# Patient Record
Sex: Female | Born: 1980 | Race: White | Hispanic: No | Marital: Married | Smoking: Current every day smoker
Health system: Southern US, Community
[De-identification: ages and names within clinical notes are randomized; demographics above are authoritative.]

## PROBLEM LIST (undated history)

## (undated) DIAGNOSIS — N289 Disorder of kidney and ureter, unspecified: Secondary | ICD-10-CM

## (undated) DIAGNOSIS — N83209 Unspecified ovarian cyst, unspecified side: Secondary | ICD-10-CM

## (undated) HISTORY — PX: OVARIAN CYST REMOVAL: SHX89

## (undated) HISTORY — PX: ECTOPIC PREGNANCY SURGERY: SHX613

---

## 2004-06-15 ENCOUNTER — Emergency Department: Payer: Self-pay | Admitting: Emergency Medicine

## 2005-06-14 ENCOUNTER — Emergency Department: Payer: Self-pay | Admitting: Emergency Medicine

## 2006-11-22 ENCOUNTER — Emergency Department: Payer: Self-pay | Admitting: Emergency Medicine

## 2007-10-11 ENCOUNTER — Emergency Department: Payer: Self-pay | Admitting: Emergency Medicine

## 2008-04-25 ENCOUNTER — Ambulatory Visit: Payer: Self-pay

## 2008-07-16 ENCOUNTER — Emergency Department: Payer: Self-pay | Admitting: Internal Medicine

## 2008-12-28 ENCOUNTER — Emergency Department: Payer: Self-pay | Admitting: Emergency Medicine

## 2008-12-31 ENCOUNTER — Emergency Department: Payer: Self-pay | Admitting: Emergency Medicine

## 2009-01-03 ENCOUNTER — Emergency Department: Payer: Self-pay | Admitting: Emergency Medicine

## 2009-07-22 ENCOUNTER — Emergency Department: Payer: Self-pay | Admitting: Emergency Medicine

## 2010-01-14 ENCOUNTER — Emergency Department: Payer: Self-pay | Admitting: Unknown Physician Specialty

## 2010-01-17 LAB — PATHOLOGY REPORT

## 2010-03-08 ENCOUNTER — Emergency Department: Payer: Self-pay | Admitting: Emergency Medicine

## 2010-06-22 ENCOUNTER — Emergency Department: Payer: Self-pay | Admitting: Unknown Physician Specialty

## 2011-07-01 ENCOUNTER — Emergency Department: Payer: Self-pay | Admitting: *Deleted

## 2011-09-19 ENCOUNTER — Emergency Department: Payer: Self-pay | Admitting: Emergency Medicine

## 2011-09-19 LAB — PREGNANCY, URINE: Pregnancy Test, Urine: NEGATIVE m[IU]/mL

## 2011-09-26 ENCOUNTER — Emergency Department: Payer: Self-pay | Admitting: Emergency Medicine

## 2014-03-15 ENCOUNTER — Emergency Department: Payer: Self-pay | Admitting: Emergency Medicine

## 2014-03-15 LAB — COMPREHENSIVE METABOLIC PANEL
ALBUMIN: 3.7 g/dL (ref 3.4–5.0)
ALK PHOS: 60 U/L
ANION GAP: 9 (ref 7–16)
BILIRUBIN TOTAL: 0.2 mg/dL (ref 0.2–1.0)
BUN: 11 mg/dL (ref 7–18)
Calcium, Total: 8.8 mg/dL (ref 8.5–10.1)
Chloride: 107 mmol/L (ref 98–107)
Co2: 23 mmol/L (ref 21–32)
Creatinine: 0.64 mg/dL (ref 0.60–1.30)
EGFR (African American): >60
EGFR (Non-African Amer.): >60
Glucose: 95 mg/dL (ref 65–99)
Osmolality: 277 (ref 275–301)
Potassium: 3.2 mmol/L — ABNORMAL LOW (ref 3.5–5.1)
SGOT(AST): 11 U/L — ABNORMAL LOW (ref 15–37)
SGPT (ALT): 18 U/L
SODIUM: 139 mmol/L (ref 136–145)
Total Protein: 6.8 g/dL (ref 6.4–8.2)

## 2014-03-15 LAB — CBC WITH DIFFERENTIAL/PLATELET
BASOS PCT: 0.4 %
Basophil #: 0 10*3/uL (ref 0.0–0.1)
Eosinophil #: 0.2 10*3/uL (ref 0.0–0.7)
Eosinophil %: 2.3 %
HCT: 39.9 % (ref 35.0–47.0)
HGB: 13.6 g/dL (ref 12.0–16.0)
LYMPHS ABS: 2.5 10*3/uL (ref 1.0–3.6)
Lymphocyte %: 24.6 %
MCH: 32.2 pg (ref 26.0–34.0)
MCHC: 34.2 g/dL (ref 32.0–36.0)
MCV: 94 fL (ref 80–100)
MONOS PCT: 8.2 %
Monocyte #: 0.8 x10 3/mm (ref 0.2–0.9)
NEUTROS ABS: 6.4 10*3/uL (ref 1.4–6.5)
Neutrophil %: 64.5 %
PLATELETS: 220 10*3/uL (ref 150–440)
RBC: 4.24 10*6/uL (ref 3.80–5.20)
RDW: 11.7 % (ref 11.5–14.5)
WBC: 10 10*3/uL (ref 3.6–11.0)

## 2014-03-15 LAB — URINALYSIS, COMPLETE
Bilirubin,UR: NEGATIVE
Blood: NEGATIVE
Glucose,UR: NEGATIVE mg/dL (ref 0–75)
Ketone: NEGATIVE
Nitrite: NEGATIVE
Ph: 5 (ref 4.5–8.0)
Protein: NEGATIVE
RBC,UR: 1 /HPF (ref 0–5)
SPECIFIC GRAVITY: 1.009 (ref 1.003–1.030)
Squamous Epithelial: 2
WBC UR: 3 /HPF (ref 0–5)

## 2014-03-15 LAB — WET PREP, GENITAL

## 2014-03-15 LAB — HCG, QUANTITATIVE, PREGNANCY: Beta Hcg, Quant.: 4721 m[IU]/mL — ABNORMAL HIGH

## 2014-03-15 LAB — PREGNANCY, URINE: PREGNANCY TEST, URINE: POSITIVE m[IU]/mL

## 2014-03-18 LAB — GC/CHLAMYDIA PROBE AMP

## 2014-03-28 ENCOUNTER — Emergency Department: Payer: Self-pay | Admitting: Emergency Medicine

## 2014-03-28 LAB — CBC WITH DIFFERENTIAL/PLATELET
Basophil #: 0 10*3/uL (ref 0.0–0.1)
Basophil %: 0.4 %
Eosinophil #: 0.3 10*3/uL (ref 0.0–0.7)
Eosinophil %: 2.7 %
HCT: 40.7 % (ref 35.0–47.0)
HGB: 13.7 g/dL (ref 12.0–16.0)
Lymphocyte #: 2.4 10*3/uL (ref 1.0–3.6)
Lymphocyte %: 25.5 %
MCH: 31.9 pg (ref 26.0–34.0)
MCHC: 33.6 g/dL (ref 32.0–36.0)
MCV: 95 fL (ref 80–100)
Monocyte #: 0.6 x10 3/mm (ref 0.2–0.9)
Monocyte %: 6.9 %
Neutrophil #: 6.1 10*3/uL (ref 1.4–6.5)
Neutrophil %: 64.5 %
Platelet: 234 10*3/uL (ref 150–440)
RBC: 4.29 10*6/uL (ref 3.80–5.20)
RDW: 11.8 % (ref 11.5–14.5)
WBC: 9.4 10*3/uL (ref 3.6–11.0)

## 2014-03-28 LAB — COMPREHENSIVE METABOLIC PANEL
ALBUMIN: 3.9 g/dL (ref 3.4–5.0)
AST: 12 U/L — AB (ref 15–37)
Alkaline Phosphatase: 64 U/L
Anion Gap: 10 (ref 7–16)
BUN: 11 mg/dL (ref 7–18)
Bilirubin,Total: 0.1 mg/dL — ABNORMAL LOW (ref 0.2–1.0)
CALCIUM: 9.4 mg/dL (ref 8.5–10.1)
CREATININE: 0.7 mg/dL (ref 0.60–1.30)
Chloride: 105 mmol/L (ref 98–107)
Co2: 22 mmol/L (ref 21–32)
EGFR (African American): >60
EGFR (Non-African Amer.): >60
Glucose: 99 mg/dL (ref 65–99)
Osmolality: 273 (ref 275–301)
POTASSIUM: 3.3 mmol/L — AB (ref 3.5–5.1)
SGPT (ALT): 31 U/L
SODIUM: 137 mmol/L (ref 136–145)
TOTAL PROTEIN: 7.1 g/dL (ref 6.4–8.2)

## 2014-03-28 LAB — URINALYSIS, COMPLETE
BILIRUBIN, UR: NEGATIVE
Blood: NEGATIVE
Glucose,UR: NEGATIVE mg/dL (ref 0–75)
Granular Cast: 4
Hyaline Cast: 4
Nitrite: NEGATIVE
Ph: 5 (ref 4.5–8.0)
Protein: 100
RBC,UR: 3 /HPF (ref 0–5)
Specific Gravity: 1.023 (ref 1.003–1.030)

## 2014-03-28 LAB — HCG, QUANTITATIVE, PREGNANCY: Beta Hcg, Quant.: 7065 m[IU]/mL — ABNORMAL HIGH

## 2014-04-02 ENCOUNTER — Emergency Department: Payer: Self-pay | Admitting: Emergency Medicine

## 2014-04-02 LAB — COMPREHENSIVE METABOLIC PANEL
Albumin: 3.8 g/dL (ref 3.4–5.0)
Alkaline Phosphatase: 61 U/L
Anion Gap: 11 (ref 7–16)
BUN: 13 mg/dL (ref 7–18)
Bilirubin,Total: 0.4 mg/dL (ref 0.2–1.0)
CALCIUM: 8.8 mg/dL (ref 8.5–10.1)
CO2: 24 mmol/L (ref 21–32)
Chloride: 104 mmol/L (ref 98–107)
Creatinine: 0.7 mg/dL (ref 0.60–1.30)
EGFR (Non-African Amer.): >60
Glucose: 109 mg/dL — ABNORMAL HIGH (ref 65–99)
Osmolality: 278 (ref 275–301)
Potassium: 3.4 mmol/L — ABNORMAL LOW (ref 3.5–5.1)
SGOT(AST): 15 U/L (ref 15–37)
SGPT (ALT): 21 U/L
Sodium: 139 mmol/L (ref 136–145)
Total Protein: 7.1 g/dL (ref 6.4–8.2)

## 2014-04-02 LAB — URINALYSIS, COMPLETE
Bilirubin,UR: NEGATIVE
Glucose,UR: NEGATIVE mg/dL (ref 0–75)
Nitrite: NEGATIVE
Ph: 5 (ref 4.5–8.0)
Protein: 100
RBC,UR: 5 /HPF (ref 0–5)
SPECIFIC GRAVITY: 1.027 (ref 1.003–1.030)

## 2014-04-02 LAB — HCG, QUANTITATIVE, PREGNANCY: BETA HCG, QUANT.: 2530 m[IU]/mL — AB

## 2014-04-02 LAB — CBC
HCT: 31.6 % — AB (ref 35.0–47.0)
HGB: 10.7 g/dL — AB (ref 12.0–16.0)
MCH: 31.7 pg (ref 26.0–34.0)
MCHC: 33.8 g/dL (ref 32.0–36.0)
MCV: 94 fL (ref 80–100)
Platelet: 255 10*3/uL (ref 150–440)
RBC: 3.37 10*6/uL — ABNORMAL LOW (ref 3.80–5.20)
RDW: 11.7 % (ref 11.5–14.5)
WBC: 15.3 10*3/uL — ABNORMAL HIGH (ref 3.6–11.0)

## 2014-04-05 ENCOUNTER — Inpatient Hospital Stay: Payer: Self-pay | Admitting: Obstetrics and Gynecology

## 2014-04-05 LAB — COMPREHENSIVE METABOLIC PANEL
ALK PHOS: 66 U/L
ANION GAP: 10 (ref 7–16)
Albumin: 3.6 g/dL (ref 3.4–5.0)
BILIRUBIN TOTAL: 0.4 mg/dL (ref 0.2–1.0)
BUN: 10 mg/dL (ref 7–18)
Calcium, Total: 9 mg/dL (ref 8.5–10.1)
Chloride: 107 mmol/L (ref 98–107)
Co2: 23 mmol/L (ref 21–32)
Creatinine: 0.68 mg/dL (ref 0.60–1.30)
EGFR (Non-African Amer.): >60
Glucose: 106 mg/dL — ABNORMAL HIGH (ref 65–99)
Osmolality: 279 (ref 275–301)
Potassium: 3.7 mmol/L (ref 3.5–5.1)
SGOT(AST): 15 U/L (ref 15–37)
SGPT (ALT): 19 U/L
Sodium: 140 mmol/L (ref 136–145)
TOTAL PROTEIN: 7.4 g/dL (ref 6.4–8.2)

## 2014-04-05 LAB — URINALYSIS, COMPLETE
Bilirubin,UR: NEGATIVE
GLUCOSE, UR: NEGATIVE mg/dL (ref 0–75)
Ketone: NEGATIVE
NITRITE: NEGATIVE
Ph: 9 (ref 4.5–8.0)
Specific Gravity: 1.017 (ref 1.003–1.030)
Squamous Epithelial: 6
WBC UR: 46 /HPF (ref 0–5)

## 2014-04-05 LAB — WET PREP, GENITAL

## 2014-04-05 LAB — CBC
HCT: 33.9 % — AB (ref 35.0–47.0)
HGB: 11.8 g/dL — ABNORMAL LOW (ref 12.0–16.0)
MCH: 32.5 pg (ref 26.0–34.0)
MCHC: 34.7 g/dL (ref 32.0–36.0)
MCV: 94 fL (ref 80–100)
PLATELETS: 311 10*3/uL (ref 150–440)
RBC: 3.62 10*6/uL — AB (ref 3.80–5.20)
RDW: 11.5 % (ref 11.5–14.5)
WBC: 10.9 10*3/uL (ref 3.6–11.0)

## 2014-04-05 LAB — GC/CHLAMYDIA PROBE AMP

## 2014-04-05 LAB — HCG, QUANTITATIVE, PREGNANCY: Beta Hcg, Quant.: 1151 m[IU]/mL — ABNORMAL HIGH

## 2014-04-06 LAB — CREATININE, SERUM
Creatinine: 0.76 mg/dL (ref 0.60–1.30)
EGFR (Non-African Amer.): >60

## 2014-04-06 LAB — HEMOGLOBIN
HGB: 7.9 g/dL — AB (ref 12.0–16.0)
HGB: 8 g/dL — AB (ref 12.0–16.0)

## 2014-08-20 LAB — SURGICAL PATHOLOGY

## 2014-08-22 NOTE — Op Note (Signed)
PATIENT NAME:  Kelli RaringFLOOD, [name] MR#:  [id number] DATE OF BIRTH:  02/18/1981  DATE OF PROCEDURE:  04/05/2014  PREOPERATIVE DIAGNOSIS: Right-sided ruptured ectopic pregnancy status post methotrexate therapy.   POSTOPERATIVE DIAGNOSIS: Right-sided ruptured ectopic pregnancy status post methotrexate therapy.   PROCEDURE: Right salpingectomy with possible oophorectomy, lysis of adhesions, and laparotomy.   ANESTHESIA: General.   SURGEON: Frederick Marro S. Valentino Saxonherry, MD.   ESTIMATED BLOOD LOSS: 400 mL. There was a hemoperitoneum of 200 mL and surgical blood loss was 200 mL.   OPERATIVE FLUIDS: 2700 mL.   URINE OUTPUT: 100 mL.   COMPLICATIONS: None.   FINDINGS: Uterus sounded to 8 cm. There was ruptured right-sided ectopic pregnancy noted on laparoscopy with a hemoperitoneum of approximately 200 mL. I was unable to visualize the right ovary. It could have possibly been incorporated with the ectopic. There was a normal-appearing left fallopian tube. The left ovary was noted to be normal with a 3 cm simple cyst that was drained with clear fluid.   SPECIMEN: Right tube and possibly right ovary.   CONDITION: Stable.   DESCRIPTION OF PROCEDURE: The patient was taken to the Operating Room where she was placed under general anesthesia without difficulty. She was then placed in the dorsal lithotomy position using Allen stirrups, and prepped and draped in normal sterile fashion for a laparoscopic procedure. A straight catheterization was performed with a return of approximately 10 mL of urine. Next, a sterile speculum was placed into the patient's vagina. A single-tooth tenaculum was used to grasp the anterior lip of the cervix. The uterus was sounded to 8 cm. Next, a Hulka clamp was then placed for uterine manipulation and the single-tooth tenaculum was removed from the patient's cervix. The speculum was also removed from the patient's vagina.   At this time, attention was then turned to the infraumbilical area  where a 5 mm vertical infraumbilical skin incision was made and a 5 mm trocar and sheath was inserted into the patient's abdomen under direct visualization using the 5 mm laparoscope. Once entrance into the abdominal cavity had been confirmed, the abdomen was insufflated adequately using carbon dioxide. The above findings were noted. In addition, there was also noted to be dense omental adhesions to the anterior abdominal wall which partially obscured the view of the ectopic pregnancy and the uterus. After these views were noted, attention was then turned to the patient's left abdomen where a second 5 mm trocar was then inserted under direct visualization careful to avoid any vasculature. Attention was then turned to the patient's right abdomen, where an 11 mm trocar was then inserted again under direct visualization. After this, the Harmonic device was inserted through the 5 mm trocar and the anterior abdominal wall adhesions were then taken down through adhesiolysis. After adhesiolysis had been performed, a grasper was inserted and used to attempt to grasp the ectopic pregnancy. It was noted that the pregnancy was adherent to the right pelvic sidewall as well as to the uterus. Manipulation was performed to free these filmy adhesions. Next, a suction irrigator was inserted to evacuate the hemoperitoneum after this and irrigation was performed. Next, the Harmonic device was used to begin at the cornual region, as the distal region of the tube could not be adequately identified due to rupture of the ectopic. The cornual region was grasped, coagulated, and transected using the Harmonic device. Serial bites were made through the mesosalpinx to attempt to remove the ectopic pregnancy down to the level of the distal end of  the fallopian tube; however, due to distortion of the tube secondary to rupture it was difficult to identify all portions of the distal end. Once the tube had been transected, an Endo Catch bag was  inserted into the 10 mm port and the ectopic pregnancy was placed in the bag and the specimen was removed through the 11 mm port.   After removal of the specimen, the trocar was then reinserted. The 11 mm trocar was then reintroduced into the lateral incision again under direct visualization. Attention was then returned to the area where the ectopic had been excised. There were some areas of bleeding noted. Attempts were made to achieve hemostasis using the Harmonic device, as well as the Kleppinger monopolar instrument; however, hemostasis could not be achieved due to the bleeding and a defective irrigator suctioner. After a third suction device had been utilized and noted to have equipment failure, the decision was made to convert to laparotomy in order to control the bleeding.   All laparoscopic trocars were removed from the patient's abdomen. A Pfannenstiel skin incision was made through a prior incision. The skin incision was taken down to the level of the fascia using the knife. The fascia was then incised in the midline and the incision was extended laterally. The rectus muscles were separated. There was noted to be dense scarring between the rectus muscles and peritoneum and it was difficult to separate bluntly, so a knife was used to transect the muscles in the midline. After separation of the muscles, the peritoneum was identified and entered. The blood was then suctioned from the abdominal cavity and a Balfour retractor was then used for visualization into the abdomen. Moist laparotomy sponges were used to pack away the bowel and other protruding peritoneum.  A single-tooth tenaculum was used to elevate the uterus from the pelvic cavity; however, due to the patient's body habitus and narrow pelvis, the uterus allowed for minimal elevation. Attention was then turned to the patient's right adnexa where several 0 Vicryl interrupted sutures were used to attempt to ligate the area of bleeding. There was  still a small amount of bleeding noted further down beyond the fallopian tube down towards the infundibulopelvic ligament. This area was cauterized using the Bovie until hemostasis was achieved. Once hemostasis was achieved, the abdomen was irrigated and Arista was placed down in the pelvis for added hemostasis. After this, all sponges were removed from the patient's abdomen. The Balfour retractor was removed from the patient's abdomen. The fascia was then closed using a 0 Vicryl in a running fashion. In the prior low transverse incision scar there was dimpling noted causing a retracted scar so the subcutaneous fat tissue level was dissected off to relieve the retraction of the prior scar. The skin was then closed using a running suture of 4-0 Monocryl. After suturing of the skin incision, LiquiBand was placed on top of the incision. Attention was then turned to the laparoscopic port sites. The 11 mm port site was closed. The fascia was attempted to be closed using a 0 Vicryl in a figure-of-eight fashion, and the skin was closed using 4-0 Monocryl. The remaining incision sites were all closed using LiquiBand. The laparoscopic incisions were injected with 0.5% Sensorcaine with epinephrine with a total of approximately 20 mL used. The Pfannenstiel skin incision was then covered above and below using a Lidoderm patch. The patient was awakened and taken to the PACU in stable condition. All instrument, sponge, and needle counts were correct x 2 at  the end of the procedure.    ____________________________ Jacques Earthly. Valentino Saxon, MD asc:at D: 04/06/2014 11:55:00 ET T: 04/06/2014 18:15:44 ET JOB#: [id number]  cc: Jacques Earthly. Valentino Saxon, MD, <Dictator> Fabian November MD ELECTRONICALLY SIGNED 04/27/2014 9:54

## 2015-09-30 ENCOUNTER — Emergency Department (HOSPITAL_COMMUNITY): Payer: Self-pay

## 2015-09-30 ENCOUNTER — Encounter (HOSPITAL_COMMUNITY): Payer: Self-pay | Admitting: Emergency Medicine

## 2015-09-30 ENCOUNTER — Emergency Department (HOSPITAL_COMMUNITY)
Admission: EM | Admit: 2015-09-30 | Discharge: 2015-09-30 | Disposition: A | Discharge status: Home / Self Care | Payer: Self-pay | Attending: Emergency Medicine | Admitting: Emergency Medicine

## 2015-09-30 DIAGNOSIS — Y999 Unspecified external cause status: Secondary | ICD-10-CM | POA: Insufficient documentation

## 2015-09-30 DIAGNOSIS — S93602A Unspecified sprain of left foot, initial encounter: Secondary | ICD-10-CM | POA: Insufficient documentation

## 2015-09-30 DIAGNOSIS — Z79899 Other long term (current) drug therapy: Secondary | ICD-10-CM | POA: Insufficient documentation

## 2015-09-30 DIAGNOSIS — Y939 Activity, unspecified: Secondary | ICD-10-CM | POA: Insufficient documentation

## 2015-09-30 DIAGNOSIS — X501XXA Overexertion from prolonged static or awkward postures, initial encounter: Secondary | ICD-10-CM | POA: Insufficient documentation

## 2015-09-30 DIAGNOSIS — F17210 Nicotine dependence, cigarettes, uncomplicated: Secondary | ICD-10-CM | POA: Insufficient documentation

## 2015-09-30 DIAGNOSIS — Y929 Unspecified place or not applicable: Secondary | ICD-10-CM | POA: Insufficient documentation

## 2015-09-30 HISTORY — DX: Disorder of kidney and ureter, unspecified: N28.9

## 2015-09-30 HISTORY — DX: Unspecified ovarian cyst, unspecified side: N83.209

## 2015-09-30 MED ORDER — NAPROXEN 500 MG PO TABS: 500.0000 mg | ORAL_TABLET | Freq: Two times a day (BID) | ORAL | Status: DC

## 2015-09-30 NOTE — ED Provider Notes (Signed)
History  By signing my name below, I, [name], attest that this documentation has been prepared under the direction and in the presence of Delrick Dehart, PA-C. Electronically Signed: [name], ED Scribe. 09/30/2015. 1:27 PM.  Chief Complaint  Patient presents with  . Foot Pain   The history is provided by the patient and medical records. No language interpreter was used.    HPI Comments:  Kelli Farrell is a 35 y.o. obese female who presents to the Emergency Department complaining of a left foot injury that occurred about one week ago. Pt states she got the left foot caught in a basket, twisted it, and fell. She reports associated swelling and bruising of the left foot. She has not taken anything for pain. Touching the area or bearing weight increases the pain. She denies alleviating factors. She denies head trauma, LOC, numbness, tingling or weakness of the left foot, ankle or leg pain, or open wounds. She denies any other injuries.  Past Medical History  Diagnosis Date  . Ovarian cyst   . Renal disorder     cysts   Past Surgical History  Procedure Laterality Date  . Ovarian cyst removal    . Ectopic pregnancy surgery     No family history on file. Social History  Substance Use Topics  . Smoking status: Current Some Day Smoker -- 1.50 packs/day    Types: Cigarettes  . Smokeless tobacco: None  . Alcohol Use: Yes     Comment: rare   OB History    No data available     Review of Systems  Constitutional: Negative for fever, chills and fatigue.  HENT: Negative for sore throat and trouble swallowing.   Respiratory: Negative for cough, shortness of breath and wheezing.   Cardiovascular: Negative for chest pain and palpitations.  Gastrointestinal: Negative for nausea, vomiting, abdominal pain and blood in stool.  Genitourinary: Negative for dysuria, hematuria and flank pain.  Musculoskeletal: Positive for joint swelling and arthralgias. Negative for myalgias,  back pain, neck pain and neck stiffness.  Skin: Positive for color change. Negative for rash and wound.  Neurological: Negative for dizziness, syncope, weakness and numbness.  Hematological: Does not bruise/bleed easily.    Allergies  Review of patient's allergies indicates no known allergies.  Home Medications   Prior to Admission medications   Medication Sig Start Date End Date Taking? Authorizing Provider  citalopram (CELEXA) 40 MG tablet Take 40 mg by mouth. 11/01/14 11/01/15 Yes Historical Provider, MD  ferrous sulfate 325 (65 FE) MG tablet Take 325 mg by mouth daily with breakfast.   Yes Historical Provider, MD   Triage Vitals: BP 154/95 mmHg  Pulse 74  Temp(Src) 98.7 F (37.1 C) (Oral)  Resp 16  Ht 6' (1.829 m)  Wt 280 lb (127.007 kg)  BMI 37.97 kg/m2  SpO2 100%  LMP 09/16/2015 Physical Exam  Constitutional: She is oriented to person, place, and time. She appears well-developed and well-nourished.  HENT:  Head: Normocephalic and atraumatic.  Eyes: EOM are normal.  Neck: Normal range of motion.  Cardiovascular: Normal rate, regular rhythm and normal heart sounds.  Exam reveals no gallop and no friction rub.   No murmur heard. DP pulses of left foot intact.  Pulmonary/Chest: Effort normal and breath sounds normal. No respiratory distress. She has no wheezes. She has no rales.  Musculoskeletal: She exhibits edema and tenderness.  Tenderness to the dorsal left foot. Ecchymosis of the lateral and plantar foot. Mild to moderate ankle edema. Compartments  soft.  Neurological: She is alert and oriented to person, place, and time.  Sensations intact.  Skin: Skin is warm and dry.  Psychiatric: She has a normal mood and affect. Her behavior is normal.  Nursing note and vitals reviewed.   ED Course  Procedures (including critical care time) DIAGNOSTIC STUDIES: Oxygen Saturation is 100% on RA, normal by my interpretation.   COORDINATION OF CARE: 1:23 PM- Will give referral to  orthopedist and order post op shoe. Advised pt to RICE area. Pt verbalizes understanding and agrees to plan.  Medications - No data to display  Labs Review Labs Reviewed - No data to display  Imaging Review Dg Foot Complete Left  09/30/2015  CLINICAL DATA:  Left foot pain and swelling.  Fall 1 week ago. EXAM: LEFT FOOT - COMPLETE 3+ VIEW COMPARISON:  None. FINDINGS: There is lateral soft tissue swelling over the left ankle. No acute bony abnormality. Specifically, no fracture, subluxation, or dislocation. Soft tissues are intact. IMPRESSION: No acute bony abnormality. Electronically Signed   By: Charlett Nose M.D.   On: 09/30/2015 12:56   I have personally reviewed and evaluated these images and lab results as part of my medical decision-making.   EKG Interpretation None      MDM   Final diagnoses:  Foot sprain, left, initial encounter    Pt with foot pain after a fall.  XR neg, NV intact.  Likely sprain.  Pt agrees to symptomatic tx, ice, post op shoe applied.  Ortho referral given   I personally performed the services described in this documentation, which was scribed in my presence. The recorded information has been reviewed and is accurate.     Pauline Aus, PA-C 10/01/15 2106  Gwyneth Sprout, MD 10/03/15 2315

## 2015-09-30 NOTE — Discharge Instructions (Signed)
Cryotherapy Cryotherapy is when you put ice on your injury. Ice helps lessen pain and puffiness (swelling) after an injury. Ice works the best when you start using it in the first 24 to 48 hours after an injury. HOME CARE  Put a dry or damp towel between the ice pack and your skin.  You may press gently on the ice pack.  Leave the ice on for no more than 10 to 20 minutes at a time.  Check your skin after 5 minutes to make sure your skin is okay.  Rest at least 20 minutes between ice pack uses.  Stop using ice when your skin loses feeling (numbness).  Do not use ice on someone who cannot tell you when it hurts. This includes small children and people with memory problems (dementia). GET HELP RIGHT AWAY IF:  You have white spots on your skin.  Your skin turns blue or pale.  Your skin feels waxy or hard.  Your puffiness gets worse. MAKE SURE YOU:   Understand these instructions.  Will watch your condition.  Will get help right away if you are not doing well or get worse.   This information is not intended to replace advice given to you by your health care provider. Make sure you discuss any questions you have with your health care provider.   Document Released: 09/30/2007 Document Revised: 07/06/2011 Document Reviewed: 12/04/2010 Elsevier Interactive Patient Education 2016 [place] Sprain A foot sprain is an injury to one of the strong bands of tissue (ligaments) that connect and support the many bones in your feet. The ligament can be stretched too much or it can tear. A tear can be either partial or complete. The severity of the sprain depends on how much of the ligament was damaged or torn. CAUSES A foot sprain is usually caused by suddenly twisting or pivoting your foot. RISK FACTORS This injury is more likely to occur in people who:  Play a sport, such as basketball or football.  Exercise or play a sport without warming up.  Start a new workout or  sport.  Suddenly increase how long or hard they exercise or play a sport. SYMPTOMS Symptoms of this condition start soon after an injury and include:  Pain, especially in the arch of the foot.  Bruising.  Swelling.  Inability to walk or use the foot to support body weight. DIAGNOSIS This condition is diagnosed with a medical history and physical exam. You may also have imaging tests, such as:  X-rays to make sure there are no broken bones (fractures).  MRI to see if the ligament has torn. TREATMENT Treatment varies depending on the severity of your sprain. Mild sprains can be treated with rest, ice, compression, and elevation (RICE). If your ligament is overstretched or partially torn, treatment usually involves keeping your foot in a fixed position (immobilization) for a period of time. To help you do this, your health care provider will apply a bandage, splint, or walking boot to keep your foot from moving until it heals. You may also be advised to use crutches or a scooter for a few weeks to avoid bearing weight on your foot while it is healing. If your ligament is fully torn, you may need surgery to reconnect the ligament to the bone. After surgery, a cast or splint will be applied and will need to stay on your foot while it heals. Your health care provider may also suggest exercises or physical therapy to strengthen  your foot. °HOME CARE INSTRUCTIONS °If You Have a Bandage, Splint, or Walking Boot: °· Wear it as directed by your health care provider. Remove it only as directed by your health care provider. °· Loosen the bandage, splint, or walking boot if your toes become numb and tingle, or if they turn cold and blue. °Bathing °· If your health care provider approves bathing and showering, cover the bandage or splint with a watertight plastic bag to protect it from water. Do not let the bandage or splint get wet. °Managing Pain, Stiffness, and Swelling  °· If directed, apply ice to the  injured area: °¨ Put ice in a plastic bag. °¨ Place a towel between your skin and the bag. °¨ Leave the ice on for 20 minutes, 2-3 times per day. °· Move your toes often to avoid stiffness and to lessen swelling. °· Raise (elevate) the injured area above the level of your heart while you are sitting or lying down. °Driving °· Do not drive or operate heavy machinery while taking pain medicine. °· Do not drive while wearing a bandage, splint, or walking boot on a foot that you use for driving. °Activity °· Rest as directed by your health care provider. °· Do not use the injured foot to support your body weight until your health care provider says that you can. Use crutches or other supportive devices as directed by your health care provider. °· Ask your health care provider what activities are safe for you. Gradually increase how much and how far you walk until your health care provider says it is safe to return to full activity. °· Do any exercise or physical therapy as directed by your health care provider. °General Instructions °· If a splint was applied, do not put pressure on any part of it until it is fully hardened. This may take several hours. °· Take medicines only as directed by your health care provider. These include over-the-counter medicines and prescription medicines. °· Keep all follow-up visits as directed by your health care provider. This is important. °· When you can walk without pain, wear supportive shoes that have stiff soles. Do not wear flip-flops, and do not walk barefoot. °SEEK MEDICAL CARE IF: °· Your pain is not controlled with medicine. °· Your bruising or swelling gets worse or does not get better with treatment. °· Your splint or walking boot is damaged. °SEEK IMMEDIATE MEDICAL CARE IF: °· Your foot is numb or blue. °· Your foot feels colder than normal. °  °This information is not intended to replace advice given to you by your health care provider. Make sure you discuss any questions  you have with your health care provider. °  °Document Released: 10/03/2001 Document Revised: 08/28/2014 Document Reviewed: 02/14/2014 °Elsevier Interactive Patient Education ©2016 [employer] ° °

## 2015-09-30 NOTE — ED Notes (Signed)
Pt c/o left foot pain/swelling after falling x 1 week ago.

## 2016-08-07 ENCOUNTER — Emergency Department
Admission: EM | Admit: 2016-08-07 | Discharge: 2016-08-07 | Disposition: A | Discharge status: Home / Self Care | Payer: Self-pay | Attending: Emergency Medicine | Admitting: Emergency Medicine

## 2016-08-07 ENCOUNTER — Encounter: Payer: Self-pay | Admitting: Emergency Medicine

## 2016-08-07 ENCOUNTER — Emergency Department: Payer: Self-pay

## 2016-08-07 DIAGNOSIS — R6889 Other general symptoms and signs: Secondary | ICD-10-CM

## 2016-08-07 DIAGNOSIS — J189 Pneumonia, unspecified organism: Secondary | ICD-10-CM

## 2016-08-07 DIAGNOSIS — J181 Lobar pneumonia, unspecified organism: Secondary | ICD-10-CM | POA: Insufficient documentation

## 2016-08-07 DIAGNOSIS — E876 Hypokalemia: Secondary | ICD-10-CM | POA: Insufficient documentation

## 2016-08-07 DIAGNOSIS — F17210 Nicotine dependence, cigarettes, uncomplicated: Secondary | ICD-10-CM | POA: Insufficient documentation

## 2016-08-07 LAB — CBC WITH DIFFERENTIAL/PLATELET
Basophils Absolute: 0.1 10*3/uL (ref 0–0.1)
Basophils Relative: 0 %
EOS PCT: 0 %
Eosinophils Absolute: 0 10*3/uL (ref 0–0.7)
HCT: 38.9 % (ref 35.0–47.0)
Hemoglobin: 13.5 g/dL (ref 12.0–16.0)
LYMPHS ABS: 1.1 10*3/uL (ref 1.0–3.6)
LYMPHS PCT: 6 %
MCH: 31.5 pg (ref 26.0–34.0)
MCHC: 34.7 g/dL (ref 32.0–36.0)
MCV: 90.8 fL (ref 80.0–100.0)
MONO ABS: 1.5 10*3/uL — AB (ref 0.2–0.9)
Monocytes Relative: 9 %
NEUTROS ABS: 14.2 10*3/uL — AB (ref 1.4–6.5)
Neutrophils Relative %: 85 %
PLATELETS: 191 10*3/uL (ref 150–440)
RBC: 4.28 MIL/uL (ref 3.80–5.20)
RDW: 11.8 % (ref 11.5–14.5)
WBC: 16.9 10*3/uL — AB (ref 3.6–11.0)

## 2016-08-07 LAB — BASIC METABOLIC PANEL
Anion gap: 7 (ref 5–15)
BUN: 9 mg/dL (ref 6–20)
CALCIUM: 8.6 mg/dL — AB (ref 8.9–10.3)
CO2: 22 mmol/L (ref 22–32)
Chloride: 102 mmol/L (ref 101–111)
Creatinine, Ser: 0.7 mg/dL (ref 0.44–1.00)
GFR calc Af Amer: >60 mL/min (ref >60)
Glucose, Bld: 123 mg/dL — ABNORMAL HIGH (ref 65–99)
Potassium: 2.9 mmol/L — ABNORMAL LOW (ref 3.5–5.1)
Sodium: 131 mmol/L — ABNORMAL LOW (ref 135–145)

## 2016-08-07 MED ORDER — HYDROCOD POLST-CPM POLST ER 10-8 MG/5ML PO SUER: 5.0000 mL | Freq: Two times a day (BID) | ORAL | 0 refills | Status: DC

## 2016-08-07 MED ORDER — SODIUM CHLORIDE 0.9 % IV BOLUS (SEPSIS)
1000.0000 mL | Freq: Once | INTRAVENOUS | Status: AC
  Administered 2016-08-07: 1000 mL via INTRAVENOUS

## 2016-08-07 MED ORDER — POTASSIUM CHLORIDE CRYS ER 20 MEQ PO TBCR
40.0000 meq | EXTENDED_RELEASE_TABLET | Freq: Once | ORAL | Status: AC
  Administered 2016-08-07: 40 meq via ORAL
  Filled 2016-08-07: qty 2

## 2016-08-07 MED ORDER — DEXTROSE 5 % IV SOLN: 1.0000 g | Freq: Once | INTRAVENOUS | Status: DC

## 2016-08-07 MED ORDER — CEFTRIAXONE SODIUM-DEXTROSE 1-3.74 GM-% IV SOLR
1.0000 g | Freq: Once | INTRAVENOUS | Status: AC
  Administered 2016-08-07: 1 g via INTRAVENOUS
  Filled 2016-08-07: qty 50

## 2016-08-07 MED ORDER — HYDROCOD POLST-CPM POLST ER 10-8 MG/5ML PO SUER
5.0000 mL | Freq: Once | ORAL | Status: AC
  Administered 2016-08-07: 5 mL via ORAL
  Filled 2016-08-07: qty 5

## 2016-08-07 MED ORDER — KETOROLAC TROMETHAMINE 30 MG/ML IJ SOLN
10.0000 mg | Freq: Once | INTRAMUSCULAR | Status: AC
  Administered 2016-08-07: 9.9 mg via INTRAVENOUS
  Filled 2016-08-07: qty 1

## 2016-08-07 MED ORDER — AZITHROMYCIN 250 MG PO TABS: 250.0000 mg | ORAL_TABLET | Freq: Every day | ORAL | 0 refills | Status: DC

## 2016-08-07 MED ORDER — ALBUTEROL SULFATE (2.5 MG/3ML) 0.083% IN NEBU
2.5000 mg | INHALATION_SOLUTION | Freq: Once | RESPIRATORY_TRACT | Status: AC
  Administered 2016-08-07: 2.5 mg via RESPIRATORY_TRACT
  Filled 2016-08-07: qty 3

## 2016-08-07 MED ORDER — KETOROLAC TROMETHAMINE 30 MG/ML IJ SOLN
INTRAMUSCULAR | Status: AC
  Filled 2016-08-07: qty 1

## 2016-08-07 MED ORDER — ALBUTEROL SULFATE HFA 108 (90 BASE) MCG/ACT IN AERS: 2.0000 | INHALATION_SPRAY | RESPIRATORY_TRACT | 0 refills | Status: DC | PRN

## 2016-08-07 MED ORDER — CEPHALEXIN 500 MG PO CAPS: 500.0000 mg | ORAL_CAPSULE | Freq: Three times a day (TID) | ORAL | 0 refills | Status: DC

## 2016-08-07 MED ORDER — KETOROLAC TROMETHAMINE 30 MG/ML IJ SOLN: 60.0000 mg | Freq: Once | INTRAMUSCULAR | Status: DC

## 2016-08-07 MED ORDER — DEXTROSE 5 % IV SOLN
500.0000 mg | Freq: Once | INTRAVENOUS | Status: AC
  Administered 2016-08-07: 500 mg via INTRAVENOUS
  Filled 2016-08-07: qty 500

## 2016-08-07 NOTE — ED Provider Notes (Signed)
[facility] Emergency Department Provider Note   ____________________________________________   First MD Initiated Contact with Patient 08/07/16 0411     (approximate)  I have reviewed the triage vital signs and the nursing notes.   HISTORY  Chief Complaint Generalized Body Aches and Cough    HPI Kelli Farrell is a 36 y.o. female who presents to the ED from home with a chief complaint of flulike symptoms. Reports a three-day history of cough productive of yellow sputum, chills, body aches, nasal congestion. Denies sick contacts. Denies associated shortness of breath, abdominal pain, nausea, vomiting, diarrhea. Complains of bilateral rib pain and occasional wheezing. Denies recent travel or trauma.   Past Medical History:  Diagnosis Date  . Ovarian cyst   . Renal disorder    cysts    There are no active problems to display for this patient.   Past Surgical History:  Procedure Laterality Date  . ECTOPIC PREGNANCY SURGERY    . OVARIAN CYST REMOVAL      Prior to Admission medications   Medication Sig Start Date End Date Taking? Authorizing Provider  albuterol (PROVENTIL HFA;VENTOLIN HFA) 108 (90 Base) MCG/ACT inhaler Inhale 2 puffs into the lungs every 4 (four) hours as needed for wheezing or shortness of breath. 08/07/16   Irean Hong, MD  azithromycin (ZITHROMAX) 250 MG tablet Take 1 tablet (250 mg total) by mouth daily. 08/07/16   Irean Hong, MD  cephALEXin (KEFLEX) 500 MG capsule Take 1 capsule (500 mg total) by mouth 3 (three) times daily. 08/07/16   Irean Hong, MD  chlorpheniramine-HYDROcodone (TUSSIONEX PENNKINETIC ER) 10-8 MG/5ML SUER Take 5 mLs by mouth 2 (two) times daily. 08/07/16   Irean Hong, MD  citalopram (CELEXA) 40 MG tablet Take 40 mg by mouth. 11/01/14 11/01/15  Historical Provider, MD  ferrous sulfate 325 (65 FE) MG tablet Take 325 mg by mouth daily with breakfast.    Historical Provider, MD  naproxen (NAPROSYN) 500 MG tablet Take 1  tablet (500 mg total) by mouth 2 (two) times daily. 09/30/15   Tammy Triplett, PA-C    Allergies Dilaudid [hydromorphone hcl]  No family history on file.  Social History Social History  Substance Use Topics  . Smoking status: Current Every Day Smoker    Packs/day: 1.50    Types: Cigarettes  . Smokeless tobacco: Never Used  . Alcohol use Yes     Comment: rare    Review of Systems  Constitutional: No fever. Positive for chills and body aches. Eyes: No visual changes. ENT: Positive for nasal congestion and sore throat. Cardiovascular: Positive for bilateral rib pain. Denies chest pain. Respiratory: Positive for cough and wheezing. Denies shortness of breath. Gastrointestinal: No abdominal pain.  No nausea, no vomiting.  No diarrhea.  No constipation. Genitourinary: Negative for dysuria. Musculoskeletal: Negative for back pain. Skin: Negative for rash. Neurological: Negative for headaches, focal weakness or numbness.  10-point ROS otherwise negative.  ____________________________________________   PHYSICAL EXAM:  VITAL SIGNS: ED Triage Vitals [08/07/16 0352]  Enc Vitals Group     BP 136/86     Pulse Rate (!) 116     Resp 18     Temp 98.3 F (36.8 C)     Temp Source Oral     SpO2 95 %     Weight 260 lb (117.9 kg)     Height 6' (1.829 m)     Head Circumference      Peak Flow  Pain Score 8     Pain Loc      Pain Edu?      Excl. in GC?     Constitutional: Alert and oriented. Well appearing and in mild acute distress. Eyes: Conjunctivae are normal. PERRL. EOMI. Head: Atraumatic. Nose: Congestion/rhinnorhea. Mouth/Throat: Mucous membranes are moist.  Oropharynx mildly erythematous without tonsillar swelling, exudates or peritonsillar abscess. Mildly hoarse voice. There is no muffled voice or drooling. Neck: No stridor.  Supple neck without meningismus. Hematological/Lymphatic/Immunilogical: No cervical lymphadenopathy. Cardiovascular: Tachycardic rate,  regular rhythm. Grossly normal heart sounds.  Good peripheral circulation. Respiratory: Normal respiratory effort.  No retractions. Lungs with faint wheezing. Gastrointestinal: Soft and nontender. No distention. No abdominal bruits. No CVA tenderness. Musculoskeletal: No lower extremity tenderness nor edema.  No joint effusions. Neurologic:  Normal speech and language. No gross focal neurologic deficits are appreciated. No gait instability. Skin:  Skin is warm, dry and intact. No rash noted. Psychiatric: Mood and affect are normal. Speech and behavior are normal.  ____________________________________________   LABS (all labs ordered are listed, but only abnormal results are displayed)  Labs Reviewed  CBC WITH DIFFERENTIAL/PLATELET - Abnormal; Notable for the following:       Result Value   WBC 16.9 (*)    Neutro Abs 14.2 (*)    Monocytes Absolute 1.5 (*)    All other components within normal limits  BASIC METABOLIC PANEL - Abnormal; Notable for the following:    Sodium 131 (*)    Potassium 2.9 (*)    Glucose, Bld 123 (*)    Calcium 8.6 (*)    All other components within normal limits  CULTURE, BLOOD (ROUTINE X 2)  CULTURE, BLOOD (ROUTINE X 2)   ____________________________________________  EKG  None ____________________________________________  RADIOLOGY  Chest x-ray (viewed by me, interpreted per Dr. Cherly Hensen): Left-sided pneumonia noted. Followup PA and lateral chest X-ray is  recommended in 3-4 weeks following trial of antibiotic therapy to  ensure resolution and exclude underlying malignancy.   ____________________________________________   PROCEDURES  Procedure(s) performed: None  Procedures  Critical Care performed: No  ____________________________________________   INITIAL IMPRESSION / ASSESSMENT AND PLAN / ED COURSE  Pertinent labs & imaging results that were available during my care of the patient were reviewed by me and considered in my medical  decision making (see chart for details).  36 year old female who presents with flulike symptoms. Scant wheezing heard on auscultation. She is afebrile but tachycardic. Will initiate IV fluid resuscitation, NSAIDs for body aches, Tussionex for cough and obtain chest x-ray.  Clinical Course as of Aug 08 655  Fri Aug 07, 2016  3244 Patient resting comfortably. Aeration improved after nebulizer treatment. Updated patient and spouse of laboratory and imaging results. Will hold for IV antibiotics and discharge home on Keflex, azithromycin, albuterol inhaler and Tussionex.  [JS]  [id number] Patient sleeping in no acute distress. IV antibiotics infusing. Anticipate discharge home after completion of IV antibiotics. Strict return precautions given. Patient and spouse verbalize understanding and agree with plan of care.  [JS]    Clinical Course User Index [JS] Irean Hong, MD     ____________________________________________   FINAL CLINICAL IMPRESSION(S) / ED DIAGNOSES  Final diagnoses:  Flu-like symptoms  Community acquired pneumonia of left lower lobe of lung (HCC)  Hypokalemia      NEW MEDICATIONS STARTED DURING THIS VISIT:  New Prescriptions   ALBUTEROL (PROVENTIL HFA;VENTOLIN HFA) 108 (90 BASE) MCG/ACT INHALER    Inhale 2 puffs into the lungs  every 4 (four) hours as needed for wheezing or shortness of breath.   AZITHROMYCIN (ZITHROMAX) 250 MG TABLET    Take 1 tablet (250 mg total) by mouth daily.   CEPHALEXIN (KEFLEX) 500 MG CAPSULE    Take 1 capsule (500 mg total) by mouth 3 (three) times daily.   CHLORPHENIRAMINE-HYDROCODONE (TUSSIONEX PENNKINETIC ER) 10-8 MG/5ML SUER    Take 5 mLs by mouth 2 (two) times daily.     Note:  This document was prepared using Dragon voice recognition software and may include unintentional dictation errors.    Irean Hong, MD 08/07/16 [id number]

## 2016-08-07 NOTE — ED Notes (Signed)
E-sign wouldn't work. Pt verbalized understanding of discharge instructions. Pt didn't have any questions.

## 2016-08-07 NOTE — ED Triage Notes (Signed)
Patient ambulatory to triage with steady gait, without difficulty or distress noted, mask in place; pt reports recent chills, body aches, nonprod cough; taking nyquil without relief

## 2016-08-07 NOTE — Discharge Instructions (Signed)
1. Take antibiotics as prescribed: Keflex 500 mg 3 times daily 7 days Azithromycin 250 mg daily 4 days 2. You may take cough medicine as needed (Tussionex). 3. You may use albuterol inhaler 2 puffs every 4 hours as needed for coughing/wheezing/breathing difficulty. 4. Drink plenty of fluids daily. 5. Return to the ER for worsening symptoms, persistent vomiting, difficulty breathing or other concerns.

## 2016-08-07 NOTE — ED Notes (Signed)
Patient c/o cough, headache that radiates down neck, anorexia, weakness, and fever.

## 2016-08-07 NOTE — ED Notes (Signed)
Patient c/o nonproductive cough, pain with cough that radiates down chest and abdomen, headache that radiates down neck, anorexia and weakness.

## 2016-08-09 LAB — BLOOD CULTURE ID PANEL (REFLEXED)
ACINETOBACTER BAUMANNII: NOT DETECTED
CANDIDA ALBICANS: NOT DETECTED
CANDIDA GLABRATA: NOT DETECTED
CANDIDA KRUSEI: NOT DETECTED
CANDIDA PARAPSILOSIS: NOT DETECTED
Candida tropicalis: NOT DETECTED
ENTEROBACTER CLOACAE COMPLEX: NOT DETECTED
ENTEROBACTERIACEAE SPECIES: NOT DETECTED
ESCHERICHIA COLI: NOT DETECTED
Enterococcus species: NOT DETECTED
Haemophilus influenzae: NOT DETECTED
KLEBSIELLA OXYTOCA: NOT DETECTED
Klebsiella pneumoniae: NOT DETECTED
LISTERIA MONOCYTOGENES: NOT DETECTED
Methicillin resistance: DETECTED — AB
Neisseria meningitidis: NOT DETECTED
PSEUDOMONAS AERUGINOSA: NOT DETECTED
Proteus species: NOT DETECTED
SERRATIA MARCESCENS: NOT DETECTED
STREPTOCOCCUS AGALACTIAE: NOT DETECTED
STREPTOCOCCUS PNEUMONIAE: NOT DETECTED
STREPTOCOCCUS PYOGENES: NOT DETECTED
Staphylococcus aureus (BCID): NOT DETECTED
Staphylococcus species: DETECTED — AB
Streptococcus species: NOT DETECTED

## 2016-08-09 NOTE — Progress Notes (Signed)
PHARMACY - PHYSICIAN COMMUNICATION CRITICAL VALUE ALERT - BLOOD CULTURE IDENTIFICATION (BCID)  Results for orders placed or performed during the hospital encounter of 08/07/16  Blood Culture ID Panel (Reflexed) (Collected: 08/07/2016  6:21 AM)  Result Value Ref Range   Enterococcus species NOT DETECTED NOT DETECTED   Listeria monocytogenes NOT DETECTED NOT DETECTED   Staphylococcus species DETECTED (A) NOT DETECTED   Staphylococcus aureus NOT DETECTED NOT DETECTED   Methicillin resistance DETECTED (A) NOT DETECTED   Streptococcus species NOT DETECTED NOT DETECTED   Streptococcus agalactiae NOT DETECTED NOT DETECTED   Streptococcus pneumoniae NOT DETECTED NOT DETECTED   Streptococcus pyogenes NOT DETECTED NOT DETECTED   Acinetobacter baumannii NOT DETECTED NOT DETECTED   Enterobacteriaceae species NOT DETECTED NOT DETECTED   Enterobacter cloacae complex NOT DETECTED NOT DETECTED   Escherichia coli NOT DETECTED NOT DETECTED   Klebsiella oxytoca NOT DETECTED NOT DETECTED   Klebsiella pneumoniae NOT DETECTED NOT DETECTED   Proteus species NOT DETECTED NOT DETECTED   Serratia marcescens NOT DETECTED NOT DETECTED   Haemophilus influenzae NOT DETECTED NOT DETECTED   Neisseria meningitidis NOT DETECTED NOT DETECTED   Pseudomonas aeruginosa NOT DETECTED NOT DETECTED   Candida albicans NOT DETECTED NOT DETECTED   Candida glabrata NOT DETECTED NOT DETECTED   Candida krusei NOT DETECTED NOT DETECTED   Candida parapsilosis NOT DETECTED NOT DETECTED   Candida tropicalis NOT DETECTED NOT DETECTED    Name of physician (or Provider) Contacted: Phineas Semen  Changes to prescribed antibiotics required: None  Janelys Glassner K 08/09/2016  1:58 PM

## 2016-08-11 LAB — CULTURE, BLOOD (ROUTINE X 2): Special Requests: ADEQUATE

## 2016-08-12 LAB — CULTURE, BLOOD (ROUTINE X 2): Culture: NO GROWTH

## 2016-08-12 NOTE — Progress Notes (Signed)
ED Antimicrobial Stewardship Positive Culture Follow Up   Kelli Farrell is an 36 y.o. female who presented to [facility] on 08/07/2016 with a chief complaint of  Chief Complaint  Patient presents with  . Generalized Body Aches  . Cough    Recent Results (from the past 720 hour(s))  Culture, blood (routine x 2)     Status: None (Preliminary result)   Collection Time: 08/07/16  6:20 AM  Result Value Ref Range Status   Specimen Description BLOOD R HAND  Final   Special Requests   Final    BOTTLES DRAWN AEROBIC AND ANAEROBIC Blood Culture results may not be optimal due to an excessive volume of blood received in culture bottles   Culture NO GROWTH 4 DAYS  Final   Report Status PENDING  Incomplete  Culture, blood (routine x 2)     Status: Abnormal   Collection Time: 08/07/16  6:21 AM  Result Value Ref Range Status   Specimen Description BLOOD R AC  Final   Special Requests   Final    BOTTLES DRAWN AEROBIC AND ANAEROBIC Blood Culture adequate volume   Culture  Setup Time   Final    GRAM POSITIVE COCCI AEROBIC BOTTLE ONLY CRITICAL RESULT CALLED TO, READ BACK BY AND VERIFIED WITH: KAREN HAYES ON 08/09/16 AT 1325 MNS    Culture (A)  Final    STAPHYLOCOCCUS SPECIES (COAGULASE NEGATIVE) THE SIGNIFICANCE OF ISOLATING THIS ORGANISM FROM A SINGLE SET OF BLOOD CULTURES WHEN MULTIPLE SETS ARE DRAWN IS UNCERTAIN. PLEASE NOTIFY THE MICROBIOLOGY DEPARTMENT WITHIN ONE WEEK IF SPECIATION AND SENSITIVITIES ARE REQUIRED. Performed at [facility], [address], [place], [place] [address]    Report Status 08/11/2016 FINAL  Final  Blood Culture ID Panel (Reflexed)     Status: Abnormal   Collection Time: 08/07/16  6:21 AM  Result Value Ref Range Status   Enterococcus species NOT DETECTED NOT DETECTED Final   Listeria monocytogenes NOT DETECTED NOT DETECTED Final   Staphylococcus species DETECTED (A) NOT DETECTED Final    Comment: Methicillin (oxacillin) resistant coagulase negative  staphylococcus. Possible blood culture contaminant (unless isolated from more than one blood culture draw or clinical case suggests pathogenicity). No antibiotic treatment is indicated for blood  culture contaminants. CRITICAL RESULT CALLED TO, READ BACK BY AND VERIFIED WITH: KAREN HAYES ON 08/09/16 AT 1325 MNS    Staphylococcus aureus NOT DETECTED NOT DETECTED Final   Methicillin resistance DETECTED (A) NOT DETECTED Final    Comment: CRITICAL RESULT CALLED TO, READ BACK BY AND VERIFIED WITH: KAREN HAYES ON 08/09/16 AT 1325 MNS    Streptococcus species NOT DETECTED NOT DETECTED Final   Streptococcus agalactiae NOT DETECTED NOT DETECTED Final   Streptococcus pneumoniae NOT DETECTED NOT DETECTED Final   Streptococcus pyogenes NOT DETECTED NOT DETECTED Final   Acinetobacter baumannii NOT DETECTED NOT DETECTED Final   Enterobacteriaceae species NOT DETECTED NOT DETECTED Final   Enterobacter cloacae complex NOT DETECTED NOT DETECTED Final   Escherichia coli NOT DETECTED NOT DETECTED Final   Klebsiella oxytoca NOT DETECTED NOT DETECTED Final   Klebsiella pneumoniae NOT DETECTED NOT DETECTED Final   Proteus species NOT DETECTED NOT DETECTED Final   Serratia marcescens NOT DETECTED NOT DETECTED Final   Haemophilus influenzae NOT DETECTED NOT DETECTED Final   Neisseria meningitidis NOT DETECTED NOT DETECTED Final   Pseudomonas aeruginosa NOT DETECTED NOT DETECTED Final   Candida albicans NOT DETECTED NOT DETECTED Final   Candida glabrata NOT DETECTED NOT  DETECTED Final   Candida krusei NOT DETECTED NOT DETECTED Final   Candida parapsilosis NOT DETECTED NOT DETECTED Final   Candida tropicalis NOT DETECTED NOT DETECTED Final    BCID was called into pharmacist on 4/13 and provider did not wish to change antibiotic therapy. Grew 1/4 bottles so likely contaminant and has been previously addressed. Will make no changes at this time.  Horris Latino, PharmD Pharmacy Resident 08/12/2016 7:33 AM

## 2017-01-22 ENCOUNTER — Emergency Department
Admission: EM | Admit: 2017-01-22 | Discharge: 2017-01-22 | Disposition: A | Discharge status: Home / Self Care | Payer: Self-pay | Attending: Emergency Medicine | Admitting: Emergency Medicine

## 2017-01-22 ENCOUNTER — Emergency Department: Payer: Self-pay

## 2017-01-22 DIAGNOSIS — R079 Chest pain, unspecified: Secondary | ICD-10-CM | POA: Insufficient documentation

## 2017-01-22 DIAGNOSIS — F17210 Nicotine dependence, cigarettes, uncomplicated: Secondary | ICD-10-CM | POA: Insufficient documentation

## 2017-01-22 DIAGNOSIS — K219 Gastro-esophageal reflux disease without esophagitis: Secondary | ICD-10-CM

## 2017-01-22 DIAGNOSIS — Z79899 Other long term (current) drug therapy: Secondary | ICD-10-CM | POA: Insufficient documentation

## 2017-01-22 DIAGNOSIS — K210 Gastro-esophageal reflux disease with esophagitis: Secondary | ICD-10-CM | POA: Insufficient documentation

## 2017-01-22 LAB — CBC
HEMATOCRIT: 40.9 % (ref 35.0–47.0)
HEMOGLOBIN: 14.3 g/dL (ref 12.0–16.0)
MCH: 32.3 pg (ref 26.0–34.0)
MCHC: 35.1 g/dL (ref 32.0–36.0)
MCV: 92.1 fL (ref 80.0–100.0)
Platelets: 211 10*3/uL (ref 150–440)
RBC: 4.44 MIL/uL (ref 3.80–5.20)
RDW: 11.9 % (ref 11.5–14.5)
WBC: 10.3 10*3/uL (ref 3.6–11.0)

## 2017-01-22 LAB — BASIC METABOLIC PANEL
ANION GAP: 10 (ref 5–15)
BUN: 16 mg/dL (ref 6–20)
CO2: 23 mmol/L (ref 22–32)
Calcium: 9.2 mg/dL (ref 8.9–10.3)
Chloride: 103 mmol/L (ref 101–111)
Creatinine, Ser: 0.69 mg/dL (ref 0.44–1.00)
GFR calc Af Amer: >60 mL/min (ref >60)
GFR calc non Af Amer: >60 mL/min (ref >60)
GLUCOSE: 84 mg/dL (ref 65–99)
POTASSIUM: 3.2 mmol/L — AB (ref 3.5–5.1)
Sodium: 136 mmol/L (ref 135–145)

## 2017-01-22 LAB — TROPONIN I: Troponin I: <0.03 ng/mL (ref <0.03)

## 2017-01-22 MED ORDER — FAMOTIDINE 20 MG PO TABS
40.0000 mg | ORAL_TABLET | Freq: Once | ORAL | Status: AC
  Administered 2017-01-22: 40 mg via ORAL
  Filled 2017-01-22: qty 2

## 2017-01-22 MED ORDER — FAMOTIDINE 20 MG PO TABS
ORAL_TABLET | ORAL | Status: AC
  Filled 2017-01-22: qty 1

## 2017-01-22 MED ORDER — GI COCKTAIL ~~LOC~~
30.0000 mL | ORAL | Status: AC
  Administered 2017-01-22: 30 mL via ORAL
  Filled 2017-01-22: qty 30

## 2017-01-22 MED ORDER — FAMOTIDINE 20 MG PO TABS: 20.0000 mg | ORAL_TABLET | Freq: Two times a day (BID) | ORAL | 0 refills | Status: AC

## 2017-01-22 MED ORDER — METOCLOPRAMIDE HCL 10 MG PO TABS
10.0000 mg | ORAL_TABLET | Freq: Once | ORAL | Status: AC
  Administered 2017-01-22: 10 mg via ORAL
  Filled 2017-01-22: qty 1

## 2017-01-22 MED ORDER — ALUMINUM-MAGNESIUM-SIMETHICONE 200-200-20 MG/5ML PO SUSP: 30.0000 mL | Freq: Three times a day (TID) | ORAL | 0 refills | Status: DC

## 2017-01-22 MED ORDER — METOCLOPRAMIDE HCL 10 MG PO TABS: 10.0000 mg | ORAL_TABLET | Freq: Four times a day (QID) | ORAL | 0 refills | Status: DC | PRN

## 2017-01-22 NOTE — ED Triage Notes (Signed)
Sharp cp to center of chest since Tuesday. Vomiting Tuesday. No NV since. Pt alert and oriented X4, active, cooperative, pt in NAD. RR even and unlabored, color WNL.

## 2017-01-22 NOTE — ED Provider Notes (Signed)
[facility] Emergency Department Provider Note  ____________________________________________  Time seen: Approximately 4:32 PM  I have reviewed the triage vital signs and the nursing notes.   HISTORY  Chief Complaint Chest Pain    HPI Kelli Farrell is a 36 y.o. female who complains of lower central anterior chest pain for the past 3 days. Intermittent lasting 1-2 minutes at a time, radiates up to the throat and feels like an acid taste in the mouth. He vomited once but not typically associated with any nausea or vomiting. No shortness of breath or diaphoresis. Not exertional, not pleuritic. Worse with position change from lying supine. No fevers chills or sweats. Pain is moderate intensity and sharp and burning in nature.     Past Medical History:  Diagnosis Date  . Ovarian cyst   . Renal disorder    cysts     There are no active problems to display for this patient.    Past Surgical History:  Procedure Laterality Date  . ECTOPIC PREGNANCY SURGERY    . OVARIAN CYST REMOVAL       Prior to Admission medications   Medication Sig Start Date End Date Taking? Authorizing Provider  albuterol (PROVENTIL HFA;VENTOLIN HFA) 108 (90 Base) MCG/ACT inhaler Inhale 2 puffs into the lungs every 4 (four) hours as needed for wheezing or shortness of breath. 08/07/16   Irean Hong, MD  aluminum-magnesium hydroxide-simethicone (MAALOX) 200-200-20 MG/5ML SUSP Take 30 mLs by mouth 4 (four) times daily -  before meals and at bedtime. 01/22/17   Sharman Cheek, MD  azithromycin (ZITHROMAX) 250 MG tablet Take 1 tablet (250 mg total) by mouth daily. 08/07/16   Irean Hong, MD  cephALEXin (KEFLEX) 500 MG capsule Take 1 capsule (500 mg total) by mouth 3 (three) times daily. 08/07/16   Irean Hong, MD  chlorpheniramine-HYDROcodone (TUSSIONEX PENNKINETIC ER) 10-8 MG/5ML SUER Take 5 mLs by mouth 2 (two) times daily. 08/07/16   Irean Hong, MD  citalopram (CELEXA) 40 MG  tablet Take 40 mg by mouth. 11/01/14 11/01/15  [provider]  famotidine (PEPCID) 20 MG tablet Take 1 tablet (20 mg total) by mouth 2 (two) times daily. 01/22/17   Sharman Cheek, MD  ferrous sulfate 325 (65 FE) MG tablet Take 325 mg by mouth daily with breakfast.    [provider]  metoCLOPramide (REGLAN) 10 MG tablet Take 1 tablet (10 mg total) by mouth every 6 (six) hours as needed. 01/22/17   Sharman Cheek, MD  naproxen (NAPROSYN) 500 MG tablet Take 1 tablet (500 mg total) by mouth 2 (two) times daily. 09/30/15   Triplett, Tammy, PA-C     Allergies Dilaudid [hydromorphone hcl]   No family history on file.  Social History Social History  Substance Use Topics  . Smoking status: Current Every Day Smoker    Packs/day: 1.50    Types: Cigarettes  . Smokeless tobacco: Never Used  . Alcohol use Yes     Comment: rare    Review of Systems  Constitutional:   No fever or chills.  ENT:   No sore throat. No rhinorrhea. Cardiovascular:  positive as above chest pain without syncope. Respiratory:   No dyspnea or cough. Gastrointestinal:   Negative for abdominal pain, vomiting and diarrhea.  Musculoskeletal:   Negative for focal pain or swelling All other systems reviewed and are negative except as documented above in ROS and HPI.  ____________________________________________   PHYSICAL EXAM:  VITAL SIGNS: ED Triage Vitals  Enc Vitals Group     BP 01/22/17 1206 (!) 143/87     Pulse Rate 01/22/17 1206 90     Resp 01/22/17 1206 18     Temp 01/22/17 1206 98.3 F (36.8 C)     Temp Source 01/22/17 1206 Oral     SpO2 01/22/17 1206 99 %     Weight 01/22/17 1207 247 lb (112 kg)     Height 01/22/17 1207  (1.803 m)     Head Circumference --      Peak Flow --      Pain Score 01/22/17 1206 7     Pain Loc --      Pain Edu? --      Excl. in GC? --     Vital signs reviewed, nursing assessments reviewed.   Constitutional:   Alert and oriented. Well appearing  and in no distress. Eyes:   No scleral icterus.  EOMI. No nystagmus. No conjunctival pallor. PERRL. ENT   Head:   Normocephalic and atraumatic.   Nose:   No congestion/rhinnorhea.    Mouth/Throat:   MMM, no pharyngeal erythema. No peritonsillar mass.    Neck:   No meningismus. Full ROM Hematological/Lymphatic/Immunilogical:   No cervical lymphadenopathy. Cardiovascular:   RRR. Symmetric bilateral radial and DP pulses.  No murmurs.  Respiratory:   Normal respiratory effort without tachypnea/retractions. Breath sounds are clear and equal bilaterally. No wheezes/rales/rhonchi. Gastrointestinal:   Soft and nontender. Non distended. There is no CVA tenderness.  No rebound, rigidity, or guarding. Genitourinary:   deferred Musculoskeletal:   Normal range of motion in all extremities. No joint effusions.  No lower extremity tenderness.  No edema.chest wall nontender Neurologic:   Normal speech and language.  Motor grossly intact. No gross focal neurologic deficits are appreciated.  Skin:    Skin is warm, dry and intact. No rash noted.  No petechiae, purpura, or bullae.  ____________________________________________    LABS (pertinent positives/negatives) (all labs ordered are listed, but only abnormal results are displayed) Labs Reviewed  BASIC METABOLIC PANEL - Abnormal; Notable for the following:       Result Value   Potassium 3.2 (*)    All other components within normal limits  CBC  TROPONIN I   ____________________________________________   EKG  interpreted by me  Date: 01/22/2017  Rate: 78  Rhythm: normal sinus rhythm  QRS Axis: normal  Intervals: normal  ST/T Wave abnormalities: normal  Conduction Disutrbances: none  Narrative Interpretation: unremarkable      ____________________________________________    RADIOLOGY  Dg Chest 2 View  Result Date: 01/22/2017 CLINICAL DATA:  Chest pain.  Vomiting . EXAM: CHEST  2 VIEW COMPARISON:  08/07/2016 .  FINDINGS: Mediastinum hilar structures are normal. Previously identified infiltrate left mid lung has cleared. No new infiltrate noted. No pleural effusion or pneumothorax. Heart size normal. No acute bony abnormality . IMPRESSION: Interim clearing of left mid lung field.  No new infiltrate noted. Electronically Signed   By: Maisie Fus  Register   On: 01/22/2017 12:57    ____________________________________________   PROCEDURES Procedures  ____________________________________________   INITIAL IMPRESSION / ASSESSMENT AND PLAN / ED COURSE  Pertinent labs & imaging results that were available during my care of the patient were reviewed by me and considered in my medical decision making (see chart for details).  patient well appearing no acute distress, presents with atypical chest pain. Considering the patient's symptoms, medical history, and physical examination today, I have low suspicion for ACS,  PE, TAD, pneumothorax, carditis, mediastinitis, pneumonia, CHF, or sepsis.  I think her symptoms are most consistent with GERD. Patient given antacids, feeling better in the ED. All continue her on acid suppression therapy, follow-up with primary care. She is well-appearing, unremarkable vital signs, unremarkable labs, suitable for outpatient follow-up.      ____________________________________________   FINAL CLINICAL IMPRESSION(S) / ED DIAGNOSES  Final diagnoses:  Nonspecific chest pain  Gastroesophageal reflux disease, esophagitis presence not specified      New Prescriptions   ALUMINUM-MAGNESIUM HYDROXIDE-SIMETHICONE (MAALOX) 200-200-20 MG/5ML SUSP    Take 30 mLs by mouth 4 (four) times daily -  before meals and at bedtime.   FAMOTIDINE (PEPCID) 20 MG TABLET    Take 1 tablet (20 mg total) by mouth 2 (two) times daily.   METOCLOPRAMIDE (REGLAN) 10 MG TABLET    Take 1 tablet (10 mg total) by mouth every 6 (six) hours as needed.     Portions of this note were generated with dragon  dictation software. Dictation errors may occur despite best attempts at proofreading.    Sharman Cheek, MD 01/22/17 [phone]

## 2017-01-22 NOTE — ED Notes (Signed)
Pt discharged home after verbalizing understanding of discharge instructions; nad noted. 

## 2017-01-22 NOTE — ED Notes (Signed)
Pt presents with mid-sternal chest pain since Tuesday; had one episode of vomiting, none since. Pt denies sob, dizziness.

## 2017-10-14 ENCOUNTER — Other Ambulatory Visit: Payer: Self-pay

## 2017-10-14 ENCOUNTER — Encounter: Payer: Self-pay | Admitting: Emergency Medicine

## 2017-10-14 ENCOUNTER — Emergency Department
Admission: EM | Admit: 2017-10-14 | Discharge: 2017-10-14 | Disposition: A | Discharge status: Home / Self Care | Payer: Self-pay | Attending: Student in an Organized Health Care Education/Training Program | Admitting: Student in an Organized Health Care Education/Training Program

## 2017-10-14 DIAGNOSIS — R197 Diarrhea, unspecified: Secondary | ICD-10-CM | POA: Insufficient documentation

## 2017-10-14 DIAGNOSIS — K625 Hemorrhage of anus and rectum: Secondary | ICD-10-CM | POA: Insufficient documentation

## 2017-10-14 DIAGNOSIS — F17210 Nicotine dependence, cigarettes, uncomplicated: Secondary | ICD-10-CM | POA: Insufficient documentation

## 2017-10-14 DIAGNOSIS — Z79899 Other long term (current) drug therapy: Secondary | ICD-10-CM | POA: Insufficient documentation

## 2017-10-14 DIAGNOSIS — K649 Unspecified hemorrhoids: Secondary | ICD-10-CM | POA: Insufficient documentation

## 2017-10-14 LAB — URINALYSIS, COMPLETE (UACMP) WITH MICROSCOPIC
BACTERIA UA: NONE SEEN
Bilirubin Urine: NEGATIVE
Glucose, UA: NEGATIVE mg/dL
HGB URINE DIPSTICK: NEGATIVE
Ketones, ur: NEGATIVE mg/dL
Leukocytes, UA: NEGATIVE
NITRITE: NEGATIVE
Protein, ur: NEGATIVE mg/dL
SPECIFIC GRAVITY, URINE: 1.018 (ref 1.005–1.030)
pH: 5 (ref 5.0–8.0)

## 2017-10-14 LAB — BASIC METABOLIC PANEL
Anion gap: 8 (ref 5–15)
BUN: 15 mg/dL (ref 6–20)
CALCIUM: 8.8 mg/dL — AB (ref 8.9–10.3)
CO2: 21 mmol/L — ABNORMAL LOW (ref 22–32)
CREATININE: 0.6 mg/dL (ref 0.44–1.00)
Chloride: 108 mmol/L (ref 101–111)
GFR calc Af Amer: >60 mL/min (ref >60)
GLUCOSE: 126 mg/dL — AB (ref 65–99)
POTASSIUM: 3 mmol/L — AB (ref 3.5–5.1)
Sodium: 137 mmol/L (ref 135–145)

## 2017-10-14 LAB — CBC
HEMATOCRIT: 36.9 % (ref 35.0–47.0)
Hemoglobin: 12.7 g/dL (ref 12.0–16.0)
MCH: 32.7 pg (ref 26.0–34.0)
MCHC: 34.5 g/dL (ref 32.0–36.0)
MCV: 94.7 fL (ref 80.0–100.0)
PLATELETS: 252 10*3/uL (ref 150–440)
RBC: 3.89 MIL/uL (ref 3.80–5.20)
RDW: 12.2 % (ref 11.5–14.5)
WBC: 7.7 10*3/uL (ref 3.6–11.0)

## 2017-10-14 LAB — POCT PREGNANCY, URINE: PREG TEST UR: NEGATIVE

## 2017-10-14 MED ORDER — PROMETHAZINE HCL 25 MG PO TABS
ORAL_TABLET | ORAL | Status: AC
  Filled 2017-10-14: qty 1

## 2017-10-14 MED ORDER — DIBUCAINE 1 % RE OINT: 1.0000 "application " | TOPICAL_OINTMENT | RECTAL | 0 refills | Status: DC | PRN

## 2017-10-14 MED ORDER — PROMETHAZINE HCL 25 MG PO TABS
25.0000 mg | ORAL_TABLET | Freq: Once | ORAL | Status: AC
  Administered 2017-10-14: 25 mg via ORAL

## 2017-10-14 MED ORDER — POLYETHYLENE GLYCOL 3350 17 G PO PACK: 17.0000 g | PACK | Freq: Every day | ORAL | 0 refills | Status: DC

## 2017-10-14 MED ORDER — METOCLOPRAMIDE HCL 10 MG PO TABS: 10.0000 mg | ORAL_TABLET | Freq: Four times a day (QID) | ORAL | 0 refills | Status: DC | PRN

## 2017-10-14 MED ORDER — HYDROCORTISONE ACETATE 25 MG RE SUPP: 25.0000 mg | Freq: Two times a day (BID) | RECTAL | 1 refills | Status: AC

## 2017-10-14 NOTE — ED Provider Notes (Signed)
[facility] Emergency Department Provider Note    First MD Initiated Contact with Patient 10/14/17 1656     (approximate)  I have reviewed the triage vital signs and the nursing notes.   HISTORY  Chief Complaint Dizziness and Rectal Bleeding    HPI Kelli Farrell is a 37 y.o. female with a history of ovarian cyst as well as polycystic kidney disease presents to the ER with several days of watery diarrhea as well as lightheadedness and having episodes of blood in the toilet for the past day particularly after she wipes.  States that she is having some discomfort and pain after moving her bowels.  She is not on any blood thinners.  Denies any history of heartburn or reflux.  No history of cirrhosis or liver disease.  Denies any recent trauma.  No fevers.    Past Medical History:  Diagnosis Date  . Ovarian cyst   . Renal disorder    cysts   History reviewed. No pertinent family history. Past Surgical History:  Procedure Laterality Date  . ECTOPIC PREGNANCY SURGERY    . OVARIAN CYST REMOVAL     There are no active problems to display for this patient.     Prior to Admission medications   Medication Sig Start Date End Date Taking? Authorizing Provider  albuterol (PROVENTIL HFA;VENTOLIN HFA) 108 (90 Base) MCG/ACT inhaler Inhale 2 puffs into the lungs every 4 (four) hours as needed for wheezing or shortness of breath. 08/07/16   Irean HongSung, Jade J, MD  aluminum-magnesium hydroxide-simethicone (MAALOX) 200-200-20 MG/5ML SUSP Take 30 mLs by mouth 4 (four) times daily -  before meals and at bedtime. 01/22/17   Sharman CheekStafford, Phillip, MD  azithromycin (ZITHROMAX) 250 MG tablet Take 1 tablet (250 mg total) by mouth daily. 08/07/16   Irean HongSung, Jade J, MD  cephALEXin (KEFLEX) 500 MG capsule Take 1 capsule (500 mg total) by mouth 3 (three) times daily. 08/07/16   Irean HongSung, Jade J, MD  chlorpheniramine-HYDROcodone (TUSSIONEX PENNKINETIC ER) 10-8 MG/5ML SUER Take 5 mLs by mouth 2 (two)  times daily. 08/07/16   Irean HongSung, Jade J, MD  citalopram (CELEXA) 40 MG tablet Take 40 mg by mouth. 11/01/14 11/01/15  [provider]  famotidine (PEPCID) 20 MG tablet Take 1 tablet (20 mg total) by mouth 2 (two) times daily. 01/22/17   Sharman CheekStafford, Phillip, MD  ferrous sulfate 325 (65 FE) MG tablet Take 325 mg by mouth daily with breakfast.    [provider]  metoCLOPramide (REGLAN) 10 MG tablet Take 1 tablet (10 mg total) by mouth every 6 (six) hours as needed. 01/22/17   Sharman CheekStafford, Phillip, MD  naproxen (NAPROSYN) 500 MG tablet Take 1 tablet (500 mg total) by mouth 2 (two) times daily. 09/30/15   Pauline Ausriplett, Tammy, PA-C    Allergies Dilaudid [hydromorphone hcl]    Social History Social History   Tobacco Use  . Smoking status: Current Every Day Smoker    Packs/day: 1.50    Types: Cigarettes  . Smokeless tobacco: Never Used  Substance Use Topics  . Alcohol use: Yes    Comment: rare  . Drug use: No    Review of Systems Patient denies headaches, rhinorrhea, blurry vision, numbness, shortness of breath, chest pain, edema, cough, abdominal pain, nausea, vomiting, diarrhea, dysuria, fevers, rashes or hallucinations unless otherwise stated above in HPI. ____________________________________________   PHYSICAL EXAM:  VITAL SIGNS: Vitals:   10/14/17 1456  BP: (!) 147/88  Pulse: 72  Resp: 20  Temp: 98.5  F (36.9 C)  SpO2: 98%    Constitutional: Alert and oriented.  Eyes: Conjunctivae are normal.  Head: Atraumatic. Nose: No congestion/rhinnorhea. Mouth/Throat: Mucous membranes are moist.   Neck: No stridor. Painless ROM.  Cardiovascular: Normal rate, regular rhythm. Grossly normal heart sounds.  Good peripheral circulation. Respiratory: Normal respiratory effort.  No retractions. Lungs CTAB. Gastrointestinal: Soft and nontender. No distention. No abdominal bruits. No CVA tenderness. Genitourinary: Several tender but nonthrombosed hemorrhoids without evidence of fissure or  rectal mass or abscess.  No evidence of active bleeding. Musculoskeletal: No lower extremity tenderness nor edema.  No joint effusions. Neurologic:  Normal speech and language. No gross focal neurologic deficits are appreciated. No facial droop Skin:  Skin is warm, dry and intact. No rash noted. Psychiatric: Mood and affect are normal. Speech and behavior are normal.  ____________________________________________   LABS (all labs ordered are listed, but only abnormal results are displayed)  Results for orders placed or performed during the hospital encounter of 10/14/17 (from the past 24 hour(s))  Basic metabolic panel     Status: Abnormal   Collection Time: 10/14/17  3:20 PM  Result Value Ref Range   Sodium 137 135 - 145 mmol/L   Potassium 3.0 (L) 3.5 - 5.1 mmol/L   Chloride 108 101 - 111 mmol/L   CO2 21 (L) 22 - 32 mmol/L   Glucose, Bld 126 (H) 65 - 99 mg/dL   BUN 15 6 - 20 mg/dL   Creatinine, Ser 4.09 0.44 - 1.00 mg/dL   Calcium 8.8 (L) 8.9 - 10.3 mg/dL   GFR calc non Af Amer >60 >60 mL/min   GFR calc Af Amer >60 >60 mL/min   Anion gap 8 5 - 15  CBC     Status: None   Collection Time: 10/14/17  3:20 PM  Result Value Ref Range   WBC 7.7 3.6 - 11.0 K/uL   RBC 3.89 3.80 - 5.20 MIL/uL   Hemoglobin 12.7 12.0 - 16.0 g/dL   HCT 81.1 91.4 - 78.2 %   MCV 94.7 80.0 - 100.0 fL   MCH 32.7 26.0 - 34.0 pg   MCHC 34.5 32.0 - 36.0 g/dL   RDW 95.6 21.3 - 08.6 %   Platelets 252 150 - 440 K/uL  Urinalysis, Complete w Microscopic     Status: Abnormal   Collection Time: 10/14/17  3:20 PM  Result Value Ref Range   Color, Urine YELLOW (A) YELLOW   APPearance CLEAR (A) CLEAR   Specific Gravity, Urine 1.018 1.005 - 1.030   pH 5.0 5.0 - 8.0   Glucose, UA NEGATIVE NEGATIVE mg/dL   Hgb urine dipstick NEGATIVE NEGATIVE   Bilirubin Urine NEGATIVE NEGATIVE   Ketones, ur NEGATIVE NEGATIVE mg/dL   Protein, ur NEGATIVE NEGATIVE mg/dL   Nitrite NEGATIVE NEGATIVE   Leukocytes, UA NEGATIVE NEGATIVE     RBC / HPF 0-5 0 - 5 RBC/hpf   WBC, UA 0-5 0 - 5 WBC/hpf   Bacteria, UA NONE SEEN NONE SEEN   Squamous Epithelial / LPF 0-5 0 - 5   Mucus PRESENT   Pregnancy, urine POC     Status: None   Collection Time: 10/14/17  3:26 PM  Result Value Ref Range   Preg Test, Ur NEGATIVE NEGATIVE   ____________________________________________  EKG My review and personal interpretation at Time: 15:08   Indication: lightheadedness  Rate: 70  Rhythm: sinus Axis: normal Other: normal intervals, no stemi ____________________________________________  RADIOLOGY   ____________________________________________   PROCEDURES  Procedure(s) performed:  Procedures    Critical Care performed: no ____________________________________________   INITIAL IMPRESSION / ASSESSMENT AND PLAN / ED COURSE  Pertinent labs & imaging results that were available during my care of the patient were reviewed by me and considered in my medical decision making (see chart for details).   DDX: Dehydration, acute blood loss anemia, upper GI bleed, lower GI bleed, anal fissure, hemorrhoid, diverticular bleed, infectious colitis  Rainey Rodger is a 37 y.o. who presents to the ED with symptoms as described above.  Patient afebrile Heema dynamically stable.  No evidence of active bleeding on exam.  Blood work sent for the above differential was reassuring with no evidence of significant dehydration or anemia.  Patient is low risk by Milderd Meager scale.  She is on blood thinners.  I do suspect that bleeding is secondary to recent diarrheal illness with irritation and inflammation of acute hemorrhoids.  Discussed strict return precautions.  Patient will be given referral to outpatient follow-up.  Discussed signs and symptoms for which she should return immediately to the hospital.  Have discussed with the patient and available family all diagnostics and treatments performed thus far and all questions were answered to the best of  my ability. The patient demonstrates understanding and agreement with plan.       As part of my medical decision making, I reviewed the following data within the electronic MEDICAL RECORD NUMBER Nursing notes reviewed and incorporated, Labs reviewed, notes from prior ED visits.   ____________________________________________   FINAL CLINICAL IMPRESSION(S) / ED DIAGNOSES  Final diagnoses:  Acute hemorrhoid  Diarrhea, unspecified type      NEW MEDICATIONS STARTED DURING THIS VISIT:  New Prescriptions   No medications on file     Note:  This document was prepared using Dragon voice recognition software and may include unintentional dictation errors.    Willy Eddy, MD 10/14/17 Windy Fast

## 2017-10-14 NOTE — ED Notes (Signed)
Patient denies pain and is resting comfortably.  

## 2017-10-14 NOTE — ED Notes (Signed)
AAOx3.  Skin warm and dry.  NAD 

## 2017-10-14 NOTE — ED Triage Notes (Signed)
Pt reports that she has had diarrhea for the last two days and when she went it was water and noticed a light red in her stool and when she wiped. States that she has felt dizzy also.

## 2017-11-30 ENCOUNTER — Other Ambulatory Visit: Payer: Self-pay

## 2017-11-30 ENCOUNTER — Emergency Department
Admission: EM | Admit: 2017-11-30 | Discharge: 2017-11-30 | Disposition: A | Discharge status: Home / Self Care | Payer: Self-pay | Attending: Emergency Medicine | Admitting: Emergency Medicine

## 2017-11-30 ENCOUNTER — Emergency Department: Payer: Self-pay

## 2017-11-30 DIAGNOSIS — F17210 Nicotine dependence, cigarettes, uncomplicated: Secondary | ICD-10-CM | POA: Insufficient documentation

## 2017-11-30 DIAGNOSIS — Z79899 Other long term (current) drug therapy: Secondary | ICD-10-CM | POA: Insufficient documentation

## 2017-11-30 DIAGNOSIS — R42 Dizziness and giddiness: Secondary | ICD-10-CM | POA: Insufficient documentation

## 2017-11-30 DIAGNOSIS — R1110 Vomiting, unspecified: Secondary | ICD-10-CM | POA: Insufficient documentation

## 2017-11-30 LAB — CBC
HEMATOCRIT: 43.5 % (ref 35.0–47.0)
Hemoglobin: 14.8 g/dL (ref 12.0–16.0)
MCH: 32.8 pg (ref 26.0–34.0)
MCHC: 34.1 g/dL (ref 32.0–36.0)
MCV: 96.2 fL (ref 80.0–100.0)
Platelets: 212 10*3/uL (ref 150–440)
RBC: 4.52 MIL/uL (ref 3.80–5.20)
RDW: 12.5 % (ref 11.5–14.5)
WBC: 7.5 10*3/uL (ref 3.6–11.0)

## 2017-11-30 LAB — URINALYSIS, COMPLETE (UACMP) WITH MICROSCOPIC
BILIRUBIN URINE: NEGATIVE
Glucose, UA: NEGATIVE mg/dL
Hgb urine dipstick: NEGATIVE
KETONES UR: NEGATIVE mg/dL
LEUKOCYTES UA: NEGATIVE
Nitrite: NEGATIVE
PH: 6 (ref 5.0–8.0)
PROTEIN: NEGATIVE mg/dL
SPECIFIC GRAVITY, URINE: 1.006 (ref 1.005–1.030)

## 2017-11-30 LAB — BASIC METABOLIC PANEL
ANION GAP: 9 (ref 5–15)
BUN: 18 mg/dL (ref 6–20)
CO2: 25 mmol/L (ref 22–32)
Calcium: 9.2 mg/dL (ref 8.9–10.3)
Chloride: 109 mmol/L (ref 98–111)
Creatinine, Ser: 0.67 mg/dL (ref 0.44–1.00)
Glucose, Bld: 90 mg/dL (ref 70–99)
POTASSIUM: 3.9 mmol/L (ref 3.5–5.1)
SODIUM: 143 mmol/L (ref 135–145)

## 2017-11-30 LAB — HCG, QUANTITATIVE, PREGNANCY: hCG, Beta Chain, Quant, S: <1 m[IU]/mL (ref <5)

## 2017-11-30 LAB — TROPONIN I

## 2017-11-30 MED ORDER — ONDANSETRON HCL 4 MG/2ML IJ SOLN
4.0000 mg | Freq: Once | INTRAMUSCULAR | Status: AC
  Administered 2017-11-30: 4 mg via INTRAVENOUS
  Filled 2017-11-30: qty 2

## 2017-11-30 MED ORDER — SODIUM CHLORIDE 0.9 % IV BOLUS
1000.0000 mL | Freq: Once | INTRAVENOUS | Status: AC
  Administered 2017-11-30: 1000 mL via INTRAVENOUS

## 2017-11-30 NOTE — Discharge Instructions (Signed)
Please seek medical attention for any high fevers, chest pain, shortness of breath, change in behavior, persistent vomiting, bloody stool or any other new or concerning symptoms.  

## 2017-11-30 NOTE — ED Provider Notes (Signed)
Feels improved.  No ectopy on monitor.  Would like to go home.  Urinalysis is pending but the patient has no urinary symptoms.  Discharged with follow-up with cardiology and primary care as Dr. Derrill KayGoodman discussed.   Kelli Farrell, Kelli Limburg, MD 11/30/17 [phone]

## 2017-11-30 NOTE — ED Provider Notes (Signed)
[facility] Emergency Department Provider Note   ____________________________________________   I have reviewed the triage vital signs and the nursing notes.   HISTORY  Chief Complaint Dizziness   History limited by: Not Limited   HPI Kelli Farrell is a 37 y.o. female who presents to the emergency department today after an acute episode of dizziness and emesis.  Patient states she was driving towards work when it hit her suddenly.  She started seeing dots in her eyes and felt sick to her stomach.  She did have one episode of vomiting.  The time my examination she is still feeling somewhat off.  She is never had symptoms like this before.  She felt like her heart was racing at the time although denies any chest pain.  Yesterday at lunch apparently she did feel some change in her vision with some black spots however that went away on its own.  She denies any recent illness.  Denies any recent travel.   Per medical record review patient has a history of ovarian cyst.  Past Medical History:  Diagnosis Date  . Ovarian cyst   . Renal disorder    cysts    There are no active problems to display for this patient.   Past Surgical History:  Procedure Laterality Date  . ECTOPIC PREGNANCY SURGERY    . OVARIAN CYST REMOVAL      Prior to Admission medications   Medication Sig Start Date End Date Taking? Authorizing Provider  albuterol (PROVENTIL HFA;VENTOLIN HFA) 108 (90 Base) MCG/ACT inhaler Inhale 2 puffs into the lungs every 4 (four) hours as needed for wheezing or shortness of breath. 08/07/16   Irean Hong, MD  aluminum-magnesium hydroxide-simethicone (MAALOX) 200-200-20 MG/5ML SUSP Take 30 mLs by mouth 4 (four) times daily -  before meals and at bedtime. 01/22/17   Sharman Cheek, MD  azithromycin (ZITHROMAX) 250 MG tablet Take 1 tablet (250 mg total) by mouth daily. 08/07/16   Irean Hong, MD  cephALEXin (KEFLEX) 500 MG capsule Take 1 capsule (500 mg  total) by mouth 3 (three) times daily. 08/07/16   Irean Hong, MD  chlorpheniramine-HYDROcodone (TUSSIONEX PENNKINETIC ER) 10-8 MG/5ML SUER Take 5 mLs by mouth 2 (two) times daily. 08/07/16   Irean Hong, MD  citalopram (CELEXA) 40 MG tablet Take 40 mg by mouth. 11/01/14 11/01/15  [provider]  dibucaine (NUPERCAINAL) 1 % OINT Place 1 application rectally as needed for hemorrhoids. 10/14/17   Willy Eddy, MD  famotidine (PEPCID) 20 MG tablet Take 1 tablet (20 mg total) by mouth 2 (two) times daily. 01/22/17   Sharman Cheek, MD  ferrous sulfate 325 (65 FE) MG tablet Take 325 mg by mouth daily with breakfast.    [provider]  hydrocortisone (ANUSOL-HC) 25 MG suppository Place 1 suppository (25 mg total) rectally every 12 (twelve) hours. 10/14/17 10/14/18  Willy Eddy, MD  metoCLOPramide (REGLAN) 10 MG tablet Take 1 tablet (10 mg total) by mouth every 6 (six) hours as needed. 01/22/17   Sharman Cheek, MD  metoCLOPramide (REGLAN) 10 MG tablet Take 1 tablet (10 mg total) by mouth every 6 (six) hours as needed for nausea. 10/14/17 10/14/18  Willy Eddy, MD  naproxen (NAPROSYN) 500 MG tablet Take 1 tablet (500 mg total) by mouth 2 (two) times daily. 09/30/15   Triplett, Tammy, PA-C  polyethylene glycol (MIRALAX / GLYCOLAX) packet Take 17 g by mouth daily. Mix one tablespoon with 8oz of your favorite juice or water  every day until you are having soft formed stools. Then start taking once daily if you didn't have a stool the day before. 10/14/17   Willy Eddy, MD    Allergies Dilaudid [hydromorphone hcl]  No family history on file.  Social History Social History   Tobacco Use  . Smoking status: Current Every Day Smoker    Packs/day: 1.50    Types: Cigarettes  . Smokeless tobacco: Never Used  Substance Use Topics  . Alcohol use: Yes    Comment: rare  . Drug use: No    Review of Systems Constitutional: No fever/chills Eyes: No visual changes. ENT: No  sore throat. Cardiovascular: Denies chest pain. Respiratory: Denies shortness of breath. Gastrointestinal: Positive for emesis. Genitourinary: Negative for dysuria. Musculoskeletal: Negative for back pain. Skin: Negative for rash. Neurological: Positive for dizziness.  ____________________________________________   PHYSICAL EXAM:  VITAL SIGNS: ED Triage Vitals  Enc Vitals Group     BP 11/30/17 0523 (!) 169/98     Pulse Rate 11/30/17 0523 82     Resp 11/30/17 0523 18     Temp --      Temp src --      SpO2 11/30/17 0523 100 %     Weight 11/30/17 0521 217 lb (98.4 kg)     Height 11/30/17 0521 5\' 11"  (1.803 m)     Head Circumference --      Peak Flow --      Pain Score 11/30/17 0521 0    Constitutional: Alert and oriented.  Eyes: Conjunctivae are normal.  ENT      Head: Normocephalic and atraumatic.      Nose: No congestion/rhinnorhea.      Mouth/Throat: Mucous membranes are moist.      Neck: No stridor. Hematological/Lymphatic/Immunilogical: No cervical lymphadenopathy. Cardiovascular: Normal rate, regular rhythm.  No murmurs, rubs, or gallops. Respiratory: Normal respiratory effort without tachypnea nor retractions. Breath sounds are clear and equal bilaterally. No wheezes/rales/rhonchi. Gastrointestinal: Soft and non tender. No rebound. No guarding.  Genitourinary: Deferred Musculoskeletal: Normal range of motion in all extremities. No lower extremity edema. Neurologic:  Normal speech and language. No gross focal neurologic deficits are appreciated.  Skin:  Skin is warm, dry and intact. No rash noted. Psychiatric: Mood and affect are normal. Speech and behavior are normal. Patient exhibits appropriate insight and judgment.  ____________________________________________    LABS (pertinent positives/negatives)  Trop <0.03 CBC wnl BMP wnl  ____________________________________________   EKG  I, Phineas Semen, attending physician, personally viewed and  interpreted this EKG  EKG Time: 0520 Rate: 71 Rhythm: normal sinus rhythm Axis: normal Intervals: qtc 417 QRS: narrow ST changes: no st elevation Impression: normal ekg   ____________________________________________    RADIOLOGY  CXR No acute findings  ____________________________________________   PROCEDURES  Procedures  ____________________________________________   INITIAL IMPRESSION / ASSESSMENT AND PLAN / ED COURSE  Pertinent labs & imaging results that were available during my care of the patient were reviewed by me and considered in my medical decision making (see chart for details).   Presented to the emergency department today because of an episode of lightheadedness and emesis.  Differential would include electrolyte abnormalities, anemia, dehydration, cardiac etiology, arrhythmia amongst other etiologies.  Will get blood work, chest x-ray, EKG.  Will give patient fluids and anti-medics.  ____________________________________________   FINAL CLINICAL IMPRESSION(S) / ED DIAGNOSES  Final diagnoses:  Dizziness  Vomiting, intractability of vomiting not specified, presence of nausea not specified, unspecified vomiting type  Note: This dictation was prepared with Dragon dictation. Any transcriptional errors that result from this process are unintentional     Phineas SemenGoodman, Donzella Carrol, MD 11/30/17 [phone]

## 2017-11-30 NOTE — ED Notes (Signed)
Pt alert and oriented X4, active, cooperative, pt in NAD. RR even and unlabored, color WNL.  Pt informed to return if any life threatening symptoms occur.  Discharge and followup instructions reviewed. Ambulates safely. Denies dizziness.

## 2017-11-30 NOTE — ED Triage Notes (Signed)
Pt c/o intermittent dizzy episodes since Monday afternoon. Pt on way to work this AM and had one episode of emesis.

## 2017-12-09 ENCOUNTER — Other Ambulatory Visit: Payer: Self-pay

## 2017-12-09 ENCOUNTER — Encounter: Payer: Self-pay | Admitting: Emergency Medicine

## 2017-12-09 ENCOUNTER — Emergency Department
Admission: EM | Admit: 2017-12-09 | Discharge: 2017-12-09 | Disposition: A | Discharge status: Home / Self Care | Payer: Medicaid Other | Attending: Emergency Medicine | Admitting: Emergency Medicine

## 2017-12-09 DIAGNOSIS — Z79899 Other long term (current) drug therapy: Secondary | ICD-10-CM | POA: Insufficient documentation

## 2017-12-09 DIAGNOSIS — F17210 Nicotine dependence, cigarettes, uncomplicated: Secondary | ICD-10-CM | POA: Insufficient documentation

## 2017-12-09 DIAGNOSIS — Z3202 Encounter for pregnancy test, result negative: Secondary | ICD-10-CM | POA: Insufficient documentation

## 2017-12-09 DIAGNOSIS — R5383 Other fatigue: Secondary | ICD-10-CM | POA: Insufficient documentation

## 2017-12-09 LAB — URINALYSIS, COMPLETE (UACMP) WITH MICROSCOPIC
Bacteria, UA: NONE SEEN
Bilirubin Urine: NEGATIVE
Glucose, UA: NEGATIVE mg/dL
HGB URINE DIPSTICK: NEGATIVE
KETONES UR: NEGATIVE mg/dL
LEUKOCYTES UA: NEGATIVE
Nitrite: NEGATIVE
Protein, ur: NEGATIVE mg/dL
SQUAMOUS EPITHELIAL / LPF: NONE SEEN (ref 0–5)
Specific Gravity, Urine: 1.003 — ABNORMAL LOW (ref 1.005–1.030)
pH: 6 (ref 5.0–8.0)

## 2017-12-09 LAB — COMPREHENSIVE METABOLIC PANEL
ALBUMIN: 4.3 g/dL (ref 3.5–5.0)
ALT: 14 U/L (ref 0–44)
ANION GAP: 7 (ref 5–15)
AST: 16 U/L (ref 15–41)
Alkaline Phosphatase: 53 U/L (ref 38–126)
BUN: 15 mg/dL (ref 6–20)
CHLORIDE: 107 mmol/L (ref 98–111)
CO2: 25 mmol/L (ref 22–32)
Calcium: 9.3 mg/dL (ref 8.9–10.3)
Creatinine, Ser: 0.74 mg/dL (ref 0.44–1.00)
GFR calc Af Amer: >60 mL/min (ref >60)
GFR calc non Af Amer: >60 mL/min (ref >60)
GLUCOSE: 133 mg/dL — AB (ref 70–99)
POTASSIUM: 3.1 mmol/L — AB (ref 3.5–5.1)
SODIUM: 139 mmol/L (ref 135–145)
Total Bilirubin: 0.6 mg/dL (ref 0.3–1.2)
Total Protein: 7 g/dL (ref 6.5–8.1)

## 2017-12-09 LAB — CBC
HCT: 38.8 % (ref 35.0–47.0)
Hemoglobin: 13.8 g/dL (ref 12.0–16.0)
MCH: 33.7 pg (ref 26.0–34.0)
MCHC: 35.5 g/dL (ref 32.0–36.0)
MCV: 94.8 fL (ref 80.0–100.0)
PLATELETS: 187 10*3/uL (ref 150–440)
RBC: 4.09 MIL/uL (ref 3.80–5.20)
RDW: 12.2 % (ref 11.5–14.5)
WBC: 6.5 10*3/uL (ref 3.6–11.0)

## 2017-12-09 LAB — HCG, QUANTITATIVE, PREGNANCY

## 2017-12-09 LAB — POCT PREGNANCY, URINE: PREG TEST UR: NEGATIVE

## 2017-12-09 NOTE — ED Triage Notes (Signed)
Pt states she feels she is pregnant, negative preg tests, however, has polycystic ovarian disease, has had ectopic pregnancy as well. States she felt "fluttering" in her stomach the other day.

## 2017-12-09 NOTE — ED Provider Notes (Signed)
[facility] Emergency Department Provider Note   ____________________________________________    I have reviewed the triage vital signs and the nursing notes.   HISTORY  Chief Complaint Fatigue    HPI Kelli RaringKristina Farrell is a 37 y.o. female with a history of polycystic kidney disease who presents with complaints of fatigue and is concerned that she may be pregnant.  She has taken 2 urine pregnancy test which have been negative but she reports this has happened to her before.  She does describe a history of ectopic pregnancy requiring surgery.  She denies any abdominal pain.  No vaginal bleeding.  She just feels very fatigued.  No breast tenderness.  Last period was early July and she is notes that her periods are usually fairly regular so this is unusual for her   Past Medical History:  Diagnosis Date  . Ovarian cyst   . Renal disorder    cysts    There are no active problems to display for this patient.   Past Surgical History:  Procedure Laterality Date  . ECTOPIC PREGNANCY SURGERY    . OVARIAN CYST REMOVAL      Prior to Admission medications   Medication Sig Start Date End Date Taking? Authorizing Provider  albuterol (PROVENTIL HFA;VENTOLIN HFA) 108 (90 Base) MCG/ACT inhaler Inhale 2 puffs into the lungs every 4 (four) hours as needed for wheezing or shortness of breath. 08/07/16   Irean HongSung, Jade J, MD  aluminum-magnesium hydroxide-simethicone (MAALOX) 200-200-20 MG/5ML SUSP Take 30 mLs by mouth 4 (four) times daily -  before meals and at bedtime. 01/22/17   Sharman CheekStafford, Phillip, MD  azithromycin (ZITHROMAX) 250 MG tablet Take 1 tablet (250 mg total) by mouth daily. 08/07/16   Irean HongSung, Jade J, MD  cephALEXin (KEFLEX) 500 MG capsule Take 1 capsule (500 mg total) by mouth 3 (three) times daily. 08/07/16   Irean HongSung, Jade J, MD  chlorpheniramine-HYDROcodone (TUSSIONEX PENNKINETIC ER) 10-8 MG/5ML SUER Take 5 mLs by mouth 2 (two) times daily. 08/07/16   Irean HongSung, Jade J, MD    citalopram (CELEXA) 40 MG tablet Take 40 mg by mouth. 11/01/14 11/01/15  [provider]  dibucaine (NUPERCAINAL) 1 % OINT Place 1 application rectally as needed for hemorrhoids. 10/14/17   Willy Eddyobinson, Patrick, MD  famotidine (PEPCID) 20 MG tablet Take 1 tablet (20 mg total) by mouth 2 (two) times daily. 01/22/17   Sharman CheekStafford, Phillip, MD  ferrous sulfate 325 (65 FE) MG tablet Take 325 mg by mouth daily with breakfast.    [provider]  hydrocortisone (ANUSOL-HC) 25 MG suppository Place 1 suppository (25 mg total) rectally every 12 (twelve) hours. 10/14/17 10/14/18  Willy Eddyobinson, Patrick, MD  metoCLOPramide (REGLAN) 10 MG tablet Take 1 tablet (10 mg total) by mouth every 6 (six) hours as needed. 01/22/17   Sharman CheekStafford, Phillip, MD  metoCLOPramide (REGLAN) 10 MG tablet Take 1 tablet (10 mg total) by mouth every 6 (six) hours as needed for nausea. 10/14/17 10/14/18  Willy Eddyobinson, Patrick, MD  naproxen (NAPROSYN) 500 MG tablet Take 1 tablet (500 mg total) by mouth 2 (two) times daily. 09/30/15   Triplett, Tammy, PA-C  polyethylene glycol (MIRALAX / GLYCOLAX) packet Take 17 g by mouth daily. Mix one tablespoon with 8oz of your favorite juice or water every day until you are having soft formed stools. Then start taking once daily if you didn't have a stool the day before. 10/14/17   Willy Eddyobinson, Patrick, MD     Allergies Dilaudid [hydromorphone hcl]  No  family history on file.  Social History Social History   Tobacco Use  . Smoking status: Current Every Day Smoker    Packs/day: 1.50    Types: Cigarettes  . Smokeless tobacco: Never Used  Substance Use Topics  . Alcohol use: Yes    Comment: rare  . Drug use: No    Review of Systems  Constitutional: No fever/chills Eyes: No visual changes.  ENT: No sore throat. Cardiovascular: Denies chest pain. Respiratory: Denies shortness of breath. Gastrointestinal: No abdominal pain.    Genitourinary: Urinary frequency Musculoskeletal: Negative for back  pain. Skin: Negative for rash. Neurological: Negative for headaches   ____________________________________________   PHYSICAL EXAM:  VITAL SIGNS: ED Triage Vitals  Enc Vitals Group     BP 12/09/17 0813 (!) 144/84     Pulse Rate 12/09/17 0813 75     Resp 12/09/17 0813 16     Temp 12/09/17 0813 98 F (36.7 C)     Temp Source 12/09/17 0813 Oral     SpO2 12/09/17 0813 100 %     Weight 12/09/17 0817 97.1 kg (214 lb)     Height 12/09/17 0817 1.829 m (6')     Head Circumference --      Peak Flow --      Pain Score 12/09/17 0814 0     Pain Loc --      Pain Edu? --      Excl. in GC? --     Constitutional: Alert and oriented. No acute distress. Pleasant and interactive   Nose: No congestion/rhinnorhea. Mouth/Throat: Mucous membranes are moist.    Cardiovascular: Normal rate, regular rhythm. Good peripheral circulation. Respiratory: Normal respiratory effort.  No retractions. Gastrointestinal: Soft and nontender. No distention.    Musculoskeletal: No lower extremity tenderness nor edema.   Neurologic:  Normal speech and language. No gross focal neurologic deficits are appreciated.  Skin:  Skin is warm, dry and intact. No rash noted. Psychiatric: Mood and affect are normal. Speech and behavior are normal.  ____________________________________________   LABS (all labs ordered are listed, but only abnormal results are displayed)  Labs Reviewed  COMPREHENSIVE METABOLIC PANEL - Abnormal; Notable for the following components:      Result Value   Potassium 3.1 (*)    Glucose, Bld 133 (*)    All other components within normal limits  URINALYSIS, COMPLETE (UACMP) WITH MICROSCOPIC - Abnormal; Notable for the following components:   Color, Urine COLORLESS (*)    APPearance CLEAR (*)    Specific Gravity, Urine 1.003 (*)    All other components within normal limits  HCG, QUANTITATIVE, PREGNANCY  CBC  POCT PREGNANCY, URINE    ____________________________________________  EKG   ____________________________________________  RADIOLOGY   ____________________________________________   PROCEDURES  Procedure(s) performed: No  Procedures   Critical Care performed: No ____________________________________________   INITIAL IMPRESSION / ASSESSMENT AND PLAN / ED COURSE  Pertinent labs & imaging results that were available during my care of the patient were reviewed by me and considered in my medical decision making (see chart for details).  Patient well-appearing and in no acute distress, exam is normal.  No vaginal bleeding.  No abdominal pain.  Will check beta-hCG, urinalysis and reevaluate  Beta-hCG is negative.  Lab work is unremarkable.  Patient is reassured    ____________________________________________   FINAL CLINICAL IMPRESSION(S) / ED DIAGNOSES  Final diagnoses:  Fatigue, unspecified type        Note:  This document was prepared using Dragon  voice recognition software and may include unintentional dictation errors.    Jene Every, MD 12/09/17 1250

## 2017-12-09 NOTE — ED Notes (Signed)
Pt presents today concerns about possible  Pregnancy. Pt states she take urine test at home they come up neg but she is pregnant in the past. Pt had a miscarriage (tubal) in 2015. Pt is A/O x4. Pt is NAD.

## 2018-01-24 NOTE — Progress Notes (Deleted)
Cardiology Office Note  Date:  01/24/2018   ID:  Kelli Farrell, DOB 07/08/1980, MRN [id number]  PCP:  Patient, No Pcp Per   No chief complaint on file.   HPI:    history of polycystic kidney disease  ectopic pregnancy requiring surgery  In the ER 11/30/2017 for dizziness acute episode of dizziness and emesis.  In the ER 01/22/2017 chest pain lower central anterior chest pain for the past 3 days. Intermittent lasting 1-2 minutes at a time, radiates up to the throat and feels like an acid taste in the mouth. He vomited once but not typically associated with any nausea or vomiting. No shortness of breath or diaphoresis. Not exertional, not pleuritic. Worse with position change from lying supine. No fevers chills or sweats. Pain is moderate intensity and sharp and burning in nature.  PMH:   has a past medical history of Ovarian cyst and Renal disorder.  PSH:    Past Surgical History:  Procedure Laterality Date  . ECTOPIC PREGNANCY SURGERY    . OVARIAN CYST REMOVAL      Current Outpatient Medications  Medication Sig Dispense Refill  . albuterol (PROVENTIL HFA;VENTOLIN HFA) 108 (90 Base) MCG/ACT inhaler Inhale 2 puffs into the lungs every 4 (four) hours as needed for wheezing or shortness of breath. 1 Inhaler 0  . aluminum-magnesium hydroxide-simethicone (MAALOX) 200-200-20 MG/5ML SUSP Take 30 mLs by mouth 4 (four) times daily -  before meals and at bedtime. 355 mL 0  . azithromycin (ZITHROMAX) 250 MG tablet Take 1 tablet (250 mg total) by mouth daily. 4 each 0  . cephALEXin (KEFLEX) 500 MG capsule Take 1 capsule (500 mg total) by mouth 3 (three) times daily. 21 capsule 0  . chlorpheniramine-HYDROcodone (TUSSIONEX PENNKINETIC ER) 10-8 MG/5ML SUER Take 5 mLs by mouth 2 (two) times daily. 70 mL 0  . citalopram (CELEXA) 40 MG tablet Take 40 mg by mouth.    . dibucaine (NUPERCAINAL) 1 % OINT Place 1 application rectally as needed for hemorrhoids. 28 g 0  . famotidine (PEPCID) 20 MG tablet  Take 1 tablet (20 mg total) by mouth 2 (two) times daily. 60 tablet 0  . ferrous sulfate 325 (65 FE) MG tablet Take 325 mg by mouth daily with breakfast.    . hydrocortisone (ANUSOL-HC) 25 MG suppository Place 1 suppository (25 mg total) rectally every 12 (twelve) hours. 12 suppository 1  . metoCLOPramide (REGLAN) 10 MG tablet Take 1 tablet (10 mg total) by mouth every 6 (six) hours as needed. 30 tablet 0  . metoCLOPramide (REGLAN) 10 MG tablet Take 1 tablet (10 mg total) by mouth every 6 (six) hours as needed for nausea. 12 tablet 0  . naproxen (NAPROSYN) 500 MG tablet Take 1 tablet (500 mg total) by mouth 2 (two) times daily. 20 tablet 0  . polyethylene glycol (MIRALAX / GLYCOLAX) packet Take 17 g by mouth daily. Mix one tablespoon with 8oz of your favorite juice or water every day until you are having soft formed stools. Then start taking once daily if you didn't have a stool the day before. 30 each 0   No current facility-administered medications for this visit.      Allergies:   Dilaudid [hydromorphone hcl]   Social History:  The patient  reports that she has been smoking cigarettes. She has been smoking about 1.50 packs per day. She has never used smokeless tobacco. She reports that she drinks alcohol. She reports that she does not use drugs.   Family  History:   family history is not on file.    Review of Systems: ROS   PHYSICAL EXAM: VS:  There were no vitals taken for this visit. , BMI There is no height or weight on file to calculate BMI. GEN: Well nourished, well developed, in no acute distress HEENT: normal Neck: no JVD, carotid bruits, or masses Cardiac: RRR; no murmurs, rubs, or gallops,no edema  Respiratory:  clear to auscultation bilaterally, normal work of breathing GI: soft, nontender, nondistended, + BS MS: no deformity or atrophy Skin: warm and dry, no rash Neuro:  Strength and sensation are intact Psych: euthymic mood, full affect    Recent Labs: 12/09/2017:  ALT 14; BUN 15; Creatinine, Ser 0.74; Hemoglobin 13.8; Platelets 187; Potassium 3.1; Sodium 139    Lipid Panel No results found for: CHOL, HDL, LDLCALC, TRIG    Wt Readings from Last 3 Encounters:  12/09/17 214 lb (97.1 kg)  11/30/17 217 lb (98.4 kg)  10/14/17 219 lb (99.3 kg)       ASSESSMENT AND PLAN:  No diagnosis found.   Disposition:   F/U  6 months  No orders of the defined types were placed in this encounter.    Signed, Dossie Arbour, M.D., Ph.D. 01/24/2018  [facility] [place], [place] [phone]

## 2018-01-26 ENCOUNTER — Ambulatory Visit: Payer: Self-pay | Admitting: Cardiovascular Disease

## 2018-02-15 IMAGING — CR DG CHEST 2V
2 series · 2 of 2 positions shown · non-contrast
Comparison: Chest radiograph performed 06/22/2010

CLINICAL DATA: Acute onset of chills, body aches and nonproductive
cough. Initial encounter.

EXAM:
CHEST  2 VIEW

[chest pa]
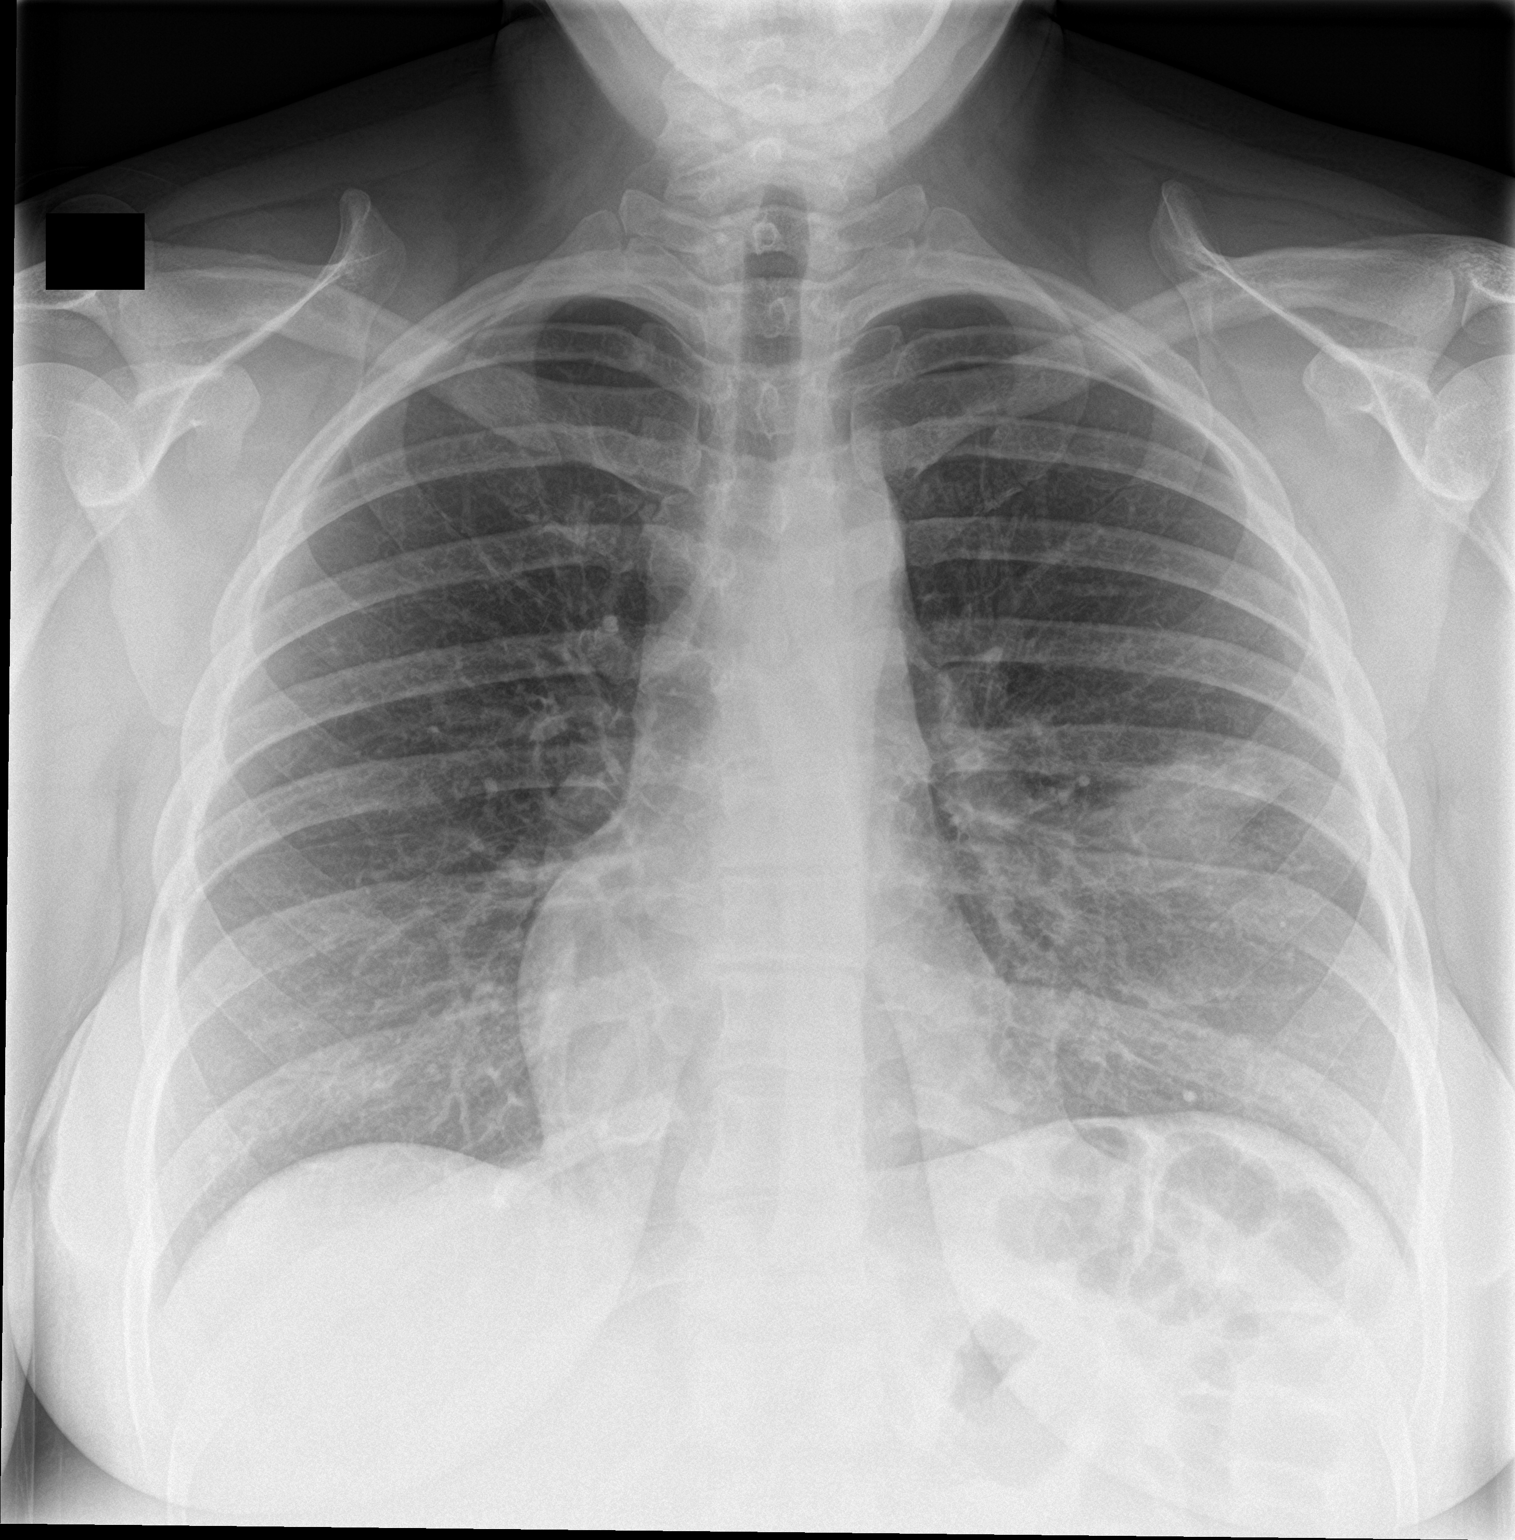

[chest lat]
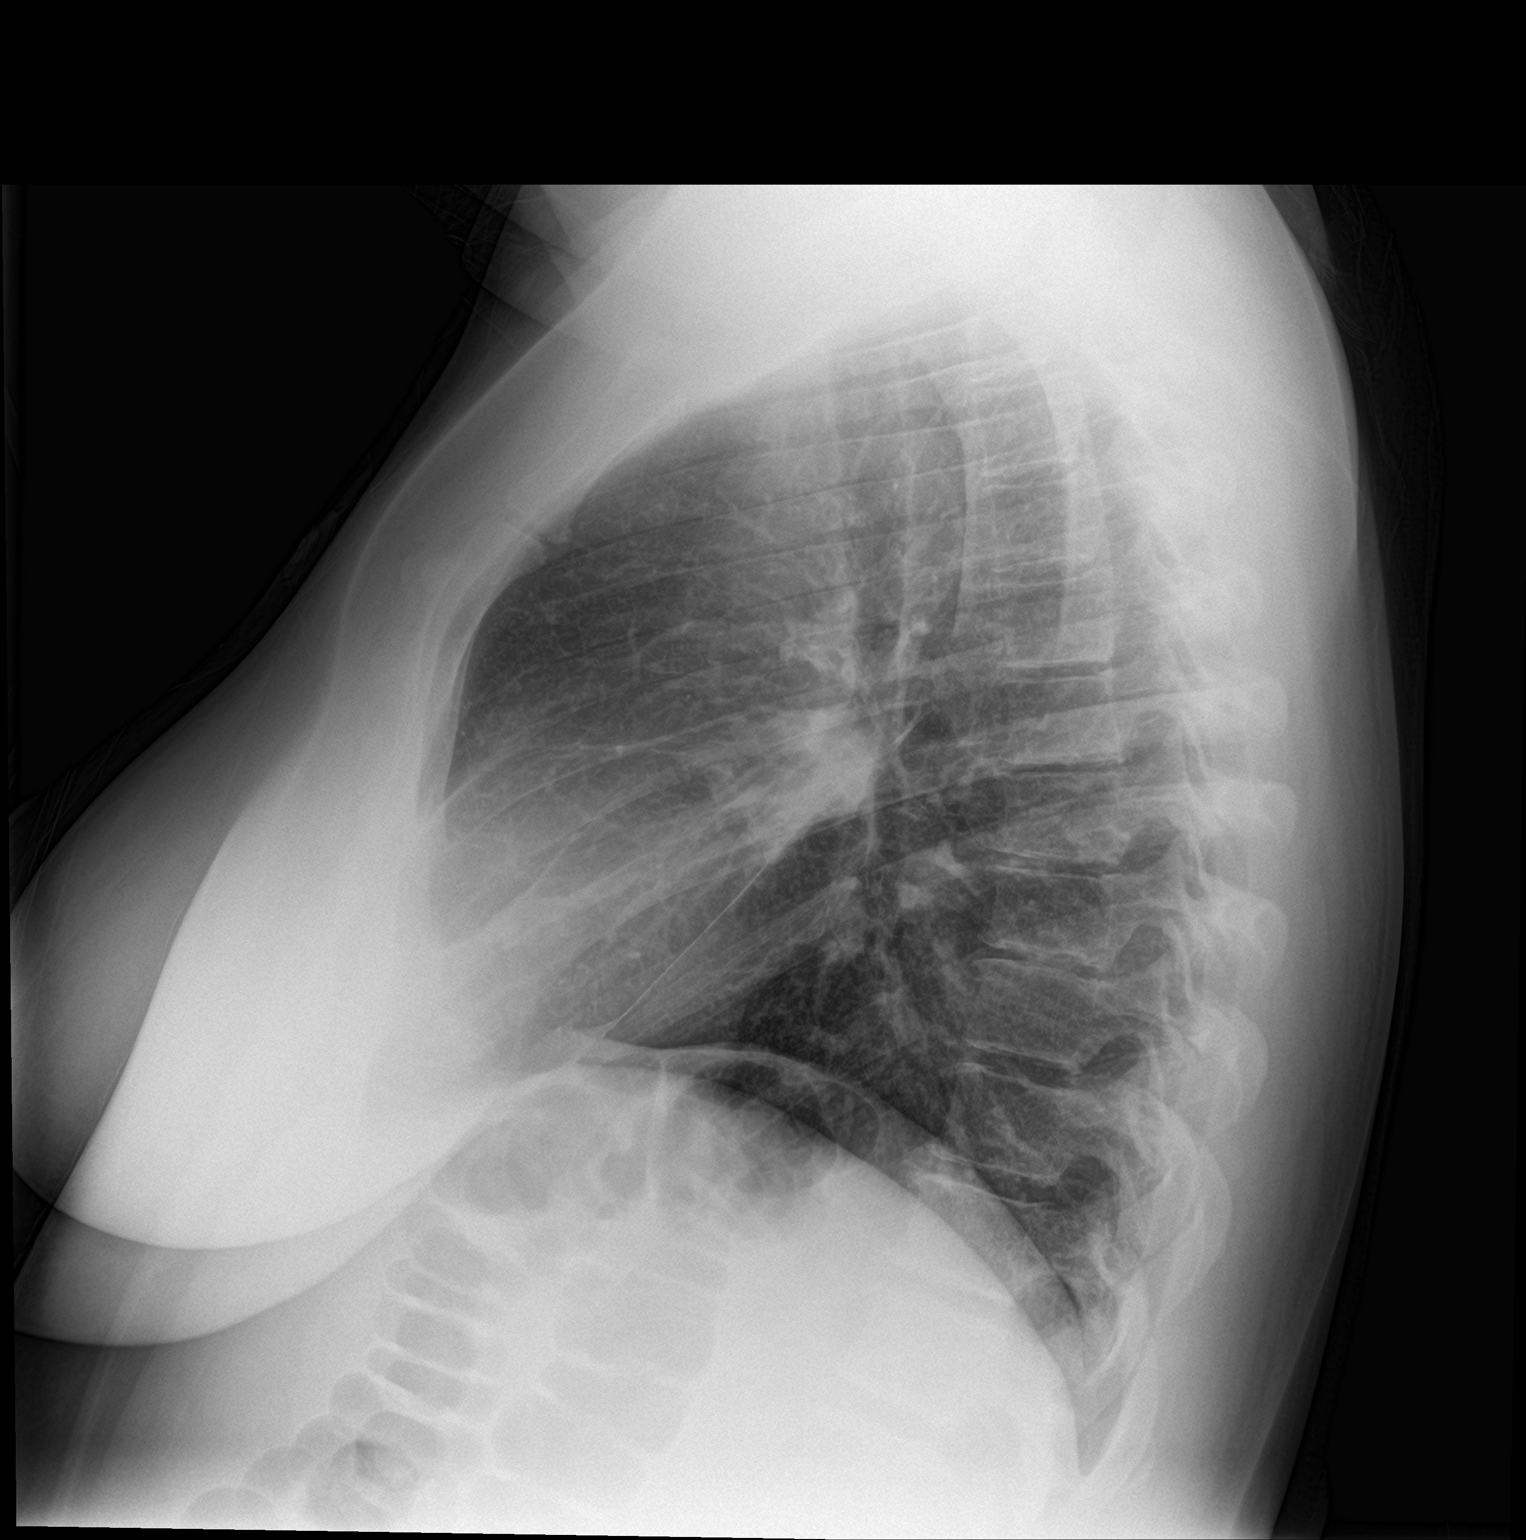

[2 of 2 positions shown; findings below may reference images not displayed]

FINDINGS: The lungs are well-aerated. Left-sided airspace opacity raises
concern for pneumonia. There is no evidence of pleural effusion or
pneumothorax.

The heart is normal in size; the mediastinal contour is within
normal limits. No acute osseous abnormalities are seen.
IMPRESSION: Left-sided pneumonia noted. Followup PA and lateral chest X-ray is
recommended in 3-4 weeks following trial of antibiotic therapy to
ensure resolution and exclude underlying malignancy.

## 2018-03-14 NOTE — Progress Notes (Unsigned)
NO SHOW

## 2018-08-02 IMAGING — CR DG CHEST 2V
2 series · 2 of 2 positions shown · non-contrast
Comparison: 08/07/2016 .

CLINICAL DATA: Chest pain.  Vomiting .

EXAM:
CHEST  2 VIEW

[chest pa]
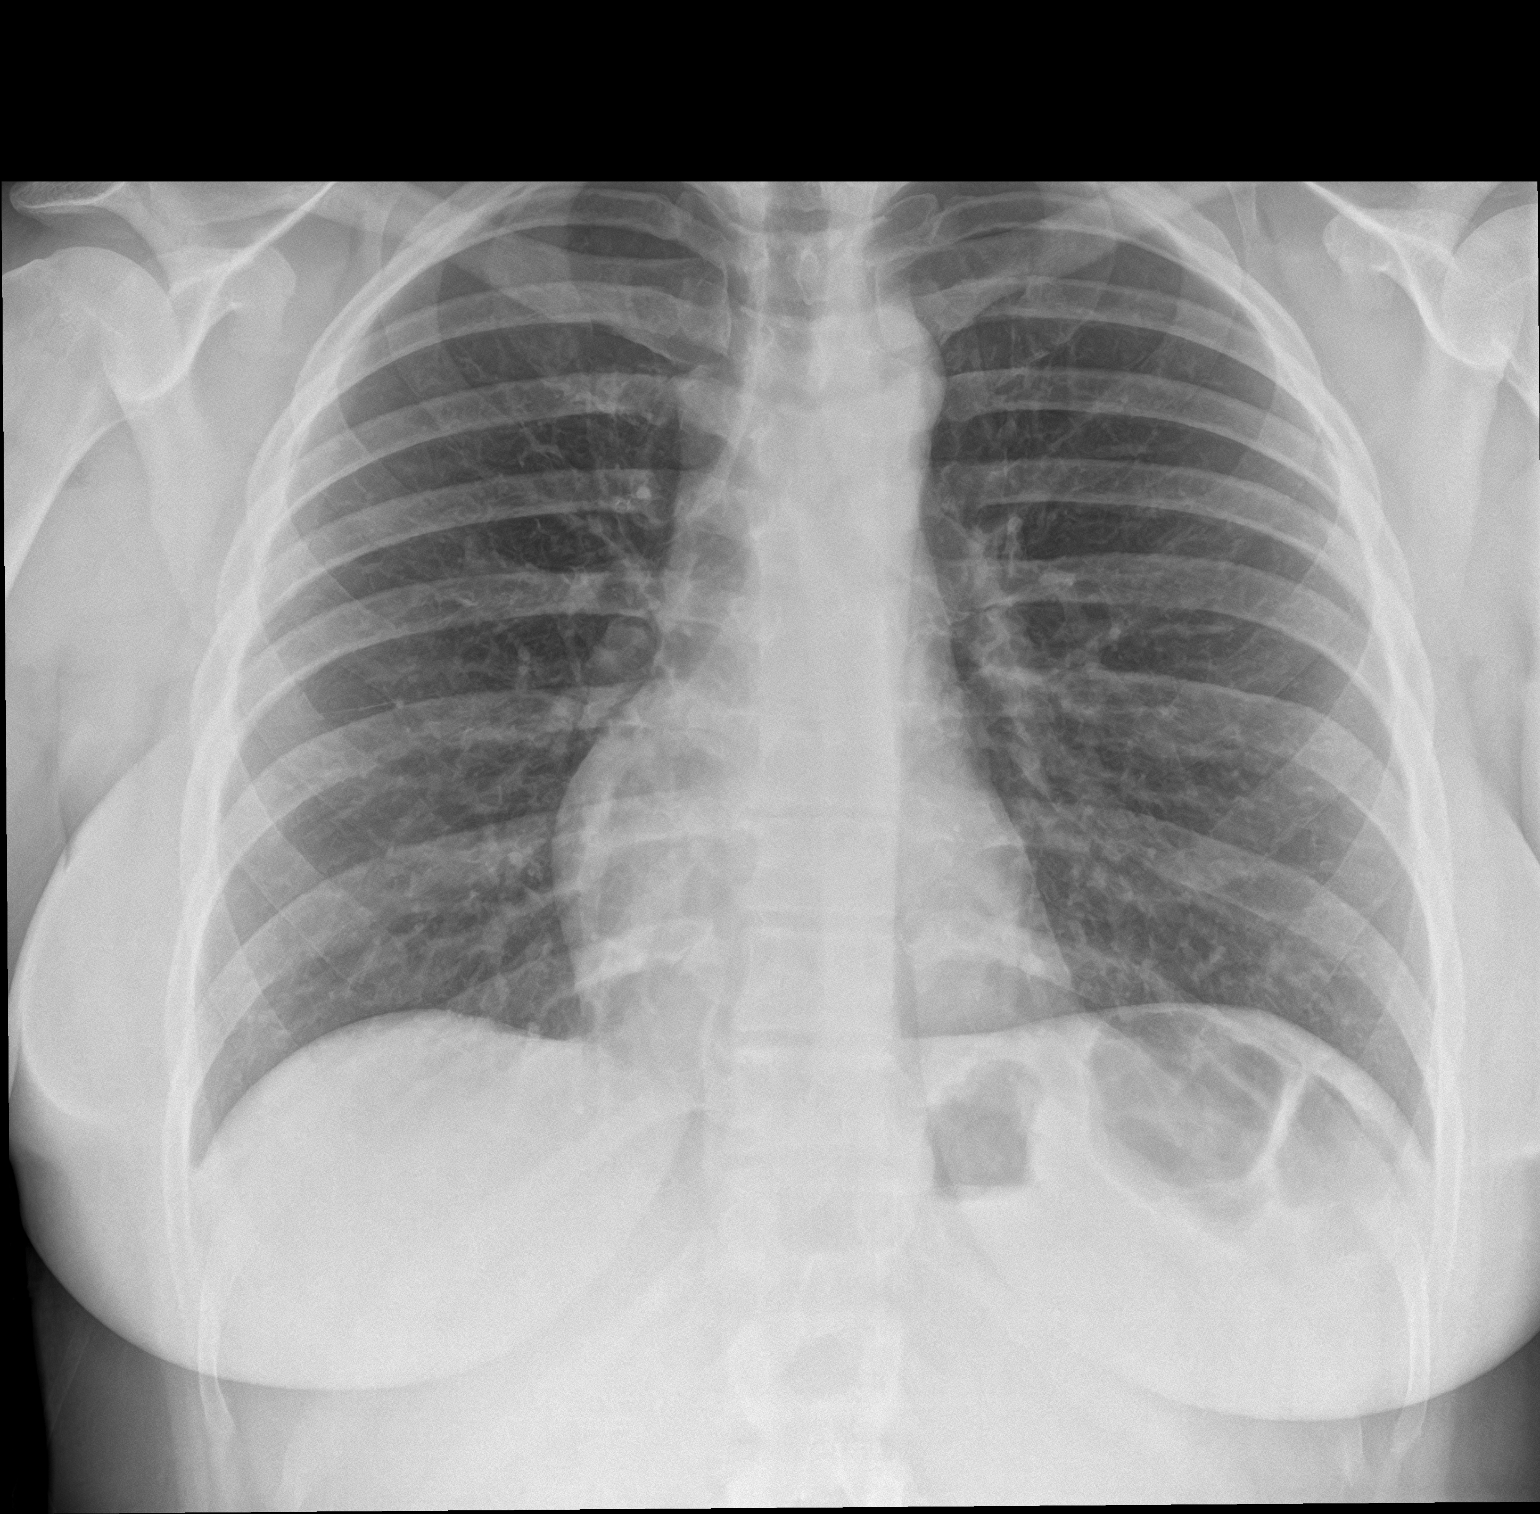

[chest lat]
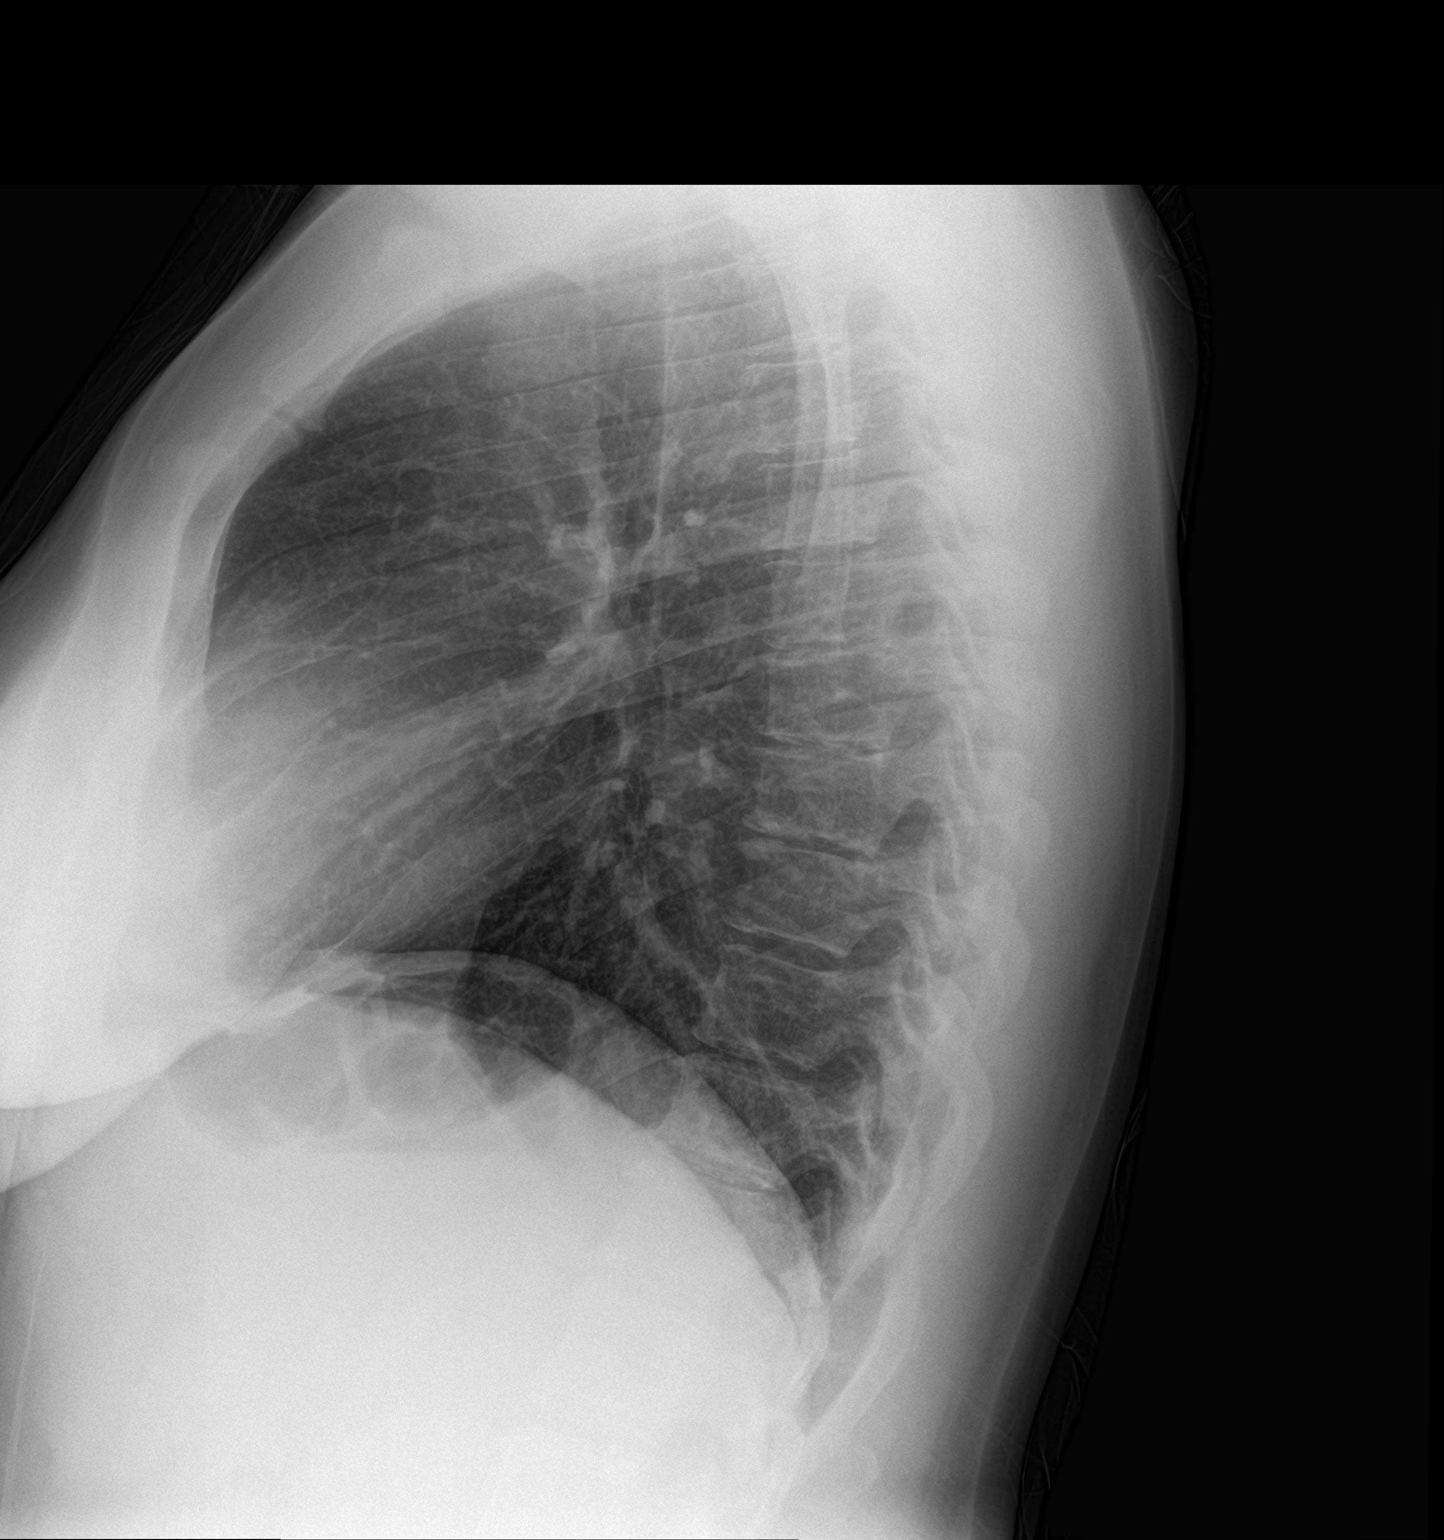

[2 of 2 positions shown; findings below may reference images not displayed]

FINDINGS: Mediastinum hilar structures are normal. Previously identified
infiltrate left mid lung has cleared. No new infiltrate noted. No
pleural effusion or pneumothorax. Heart size normal. No acute bony
abnormality .
IMPRESSION: Interim clearing of left mid lung field.  No new infiltrate noted.

## 2021-05-14 DIAGNOSIS — Z1339 Encounter for screening examination for other mental health and behavioral disorders: Secondary | ICD-10-CM | POA: Diagnosis not present

## 2021-05-14 DIAGNOSIS — Z1331 Encounter for screening for depression: Secondary | ICD-10-CM | POA: Diagnosis not present

## 2021-05-14 DIAGNOSIS — R69 Illness, unspecified: Secondary | ICD-10-CM | POA: Diagnosis not present

## 2021-08-14 DIAGNOSIS — J0140 Acute pansinusitis, unspecified: Secondary | ICD-10-CM | POA: Diagnosis not present

## 2021-10-16 DIAGNOSIS — M7042 Prepatellar bursitis, left knee: Secondary | ICD-10-CM | POA: Diagnosis not present

## 2021-10-16 DIAGNOSIS — R69 Illness, unspecified: Secondary | ICD-10-CM | POA: Diagnosis not present

## 2022-01-31 DIAGNOSIS — E669 Obesity, unspecified: Secondary | ICD-10-CM | POA: Diagnosis not present

## 2022-01-31 DIAGNOSIS — Z825 Family history of asthma and other chronic lower respiratory diseases: Secondary | ICD-10-CM | POA: Diagnosis not present

## 2022-01-31 DIAGNOSIS — Z604 Social exclusion and rejection: Secondary | ICD-10-CM | POA: Diagnosis not present

## 2022-01-31 DIAGNOSIS — Z720 Tobacco use: Secondary | ICD-10-CM | POA: Diagnosis not present

## 2022-01-31 DIAGNOSIS — Z823 Family history of stroke: Secondary | ICD-10-CM | POA: Diagnosis not present

## 2022-01-31 DIAGNOSIS — I739 Peripheral vascular disease, unspecified: Secondary | ICD-10-CM | POA: Diagnosis not present

## 2022-01-31 DIAGNOSIS — R69 Illness, unspecified: Secondary | ICD-10-CM | POA: Diagnosis not present

## 2022-01-31 DIAGNOSIS — Z6833 Body mass index (BMI) 33.0-33.9, adult: Secondary | ICD-10-CM | POA: Diagnosis not present

## 2022-01-31 DIAGNOSIS — Z8249 Family history of ischemic heart disease and other diseases of the circulatory system: Secondary | ICD-10-CM | POA: Diagnosis not present

## 2023-01-01 ENCOUNTER — Other Ambulatory Visit: Payer: Self-pay

## 2023-01-01 ENCOUNTER — Emergency Department
Admission: EM | Admit: 2023-01-01 | Discharge: 2023-01-01 | Disposition: A | Discharge status: Home / Self Care | Payer: 59 | Attending: Emergency Medicine | Admitting: Emergency Medicine

## 2023-01-01 DIAGNOSIS — R1032 Left lower quadrant pain: Secondary | ICD-10-CM | POA: Insufficient documentation

## 2023-01-01 DIAGNOSIS — R112 Nausea with vomiting, unspecified: Secondary | ICD-10-CM | POA: Insufficient documentation

## 2023-01-01 LAB — CBC
HCT: 41 % (ref 36.0–46.0)
Hemoglobin: 13.8 g/dL (ref 12.0–15.0)
MCH: 31.4 pg (ref 26.0–34.0)
MCHC: 33.7 g/dL (ref 30.0–36.0)
MCV: 93.4 fL (ref 80.0–100.0)
Platelets: 208 10*3/uL (ref 150–400)
RBC: 4.39 MIL/uL (ref 3.87–5.11)
RDW: 11.7 % (ref 11.5–15.5)
WBC: 7 10*3/uL (ref 4.0–10.5)
nRBC: 0 % (ref 0.0–0.2)

## 2023-01-01 LAB — COMPREHENSIVE METABOLIC PANEL
ALT: 13 U/L (ref 0–44)
AST: 13 U/L — ABNORMAL LOW (ref 15–41)
Albumin: 4 g/dL (ref 3.5–5.0)
Alkaline Phosphatase: 52 U/L (ref 38–126)
Anion gap: 8 (ref 5–15)
BUN: 20 mg/dL (ref 6–20)
CO2: 23 mmol/L (ref 22–32)
Calcium: 9.1 mg/dL (ref 8.9–10.3)
Chloride: 106 mmol/L (ref 98–111)
Creatinine, Ser: 0.65 mg/dL (ref 0.44–1.00)
GFR, Estimated: >60 mL/min (ref >60)
Glucose, Bld: 88 mg/dL (ref 70–99)
Potassium: 3.5 mmol/L (ref 3.5–5.1)
Sodium: 137 mmol/L (ref 135–145)
Total Bilirubin: <0.1 mg/dL — ABNORMAL LOW (ref 0.3–1.2)
Total Protein: 7.1 g/dL (ref 6.5–8.1)

## 2023-01-01 LAB — URINALYSIS, ROUTINE W REFLEX MICROSCOPIC
Bilirubin Urine: NEGATIVE
Glucose, UA: NEGATIVE mg/dL
Hgb urine dipstick: NEGATIVE
Ketones, ur: NEGATIVE mg/dL
Nitrite: NEGATIVE
Protein, ur: NEGATIVE mg/dL
Specific Gravity, Urine: 1.008 (ref 1.005–1.030)
pH: 5 (ref 5.0–8.0)

## 2023-01-01 LAB — LIPASE, BLOOD: Lipase: 30 U/L (ref 11–51)

## 2023-01-01 LAB — POC URINE PREG, ED: Preg Test, Ur: NEGATIVE

## 2023-01-01 LAB — HCG, QUANTITATIVE, PREGNANCY: hCG, Beta Chain, Quant, S: <1 m[IU]/mL (ref <5)

## 2023-01-01 MED ORDER — ONDANSETRON 4 MG PO TBDP
4.0000 mg | ORAL_TABLET | Freq: Once | ORAL | Status: AC
  Administered 2023-01-01: 4 mg via ORAL
  Filled 2023-01-01: qty 1

## 2023-01-01 MED ORDER — ONDANSETRON HCL 4 MG PO TABS: 4.0000 mg | ORAL_TABLET | Freq: Four times a day (QID) | ORAL | 0 refills | Status: AC | PRN

## 2023-01-01 NOTE — ED Triage Notes (Signed)
Pt here with nausea and LLQ abd pain. Pt states she has been nauseous for the past few days but ended up throwing up this morning. Pt states the pain sometimes radiates to her back. Pt denies fever.

## 2023-01-01 NOTE — Discharge Instructions (Addendum)
You were seen in the ER today for your nausea and vomiting.  Your blood work was overall reassuring.  I sent a prescription for nausea medicine to your pharmacy that you can take as needed.  Return to the ER for new or worsening symptoms including development of worsening abdominal pain, inability tolerate food or liquids, or any other new or concerning symptoms.

## 2023-01-01 NOTE — ED Provider Notes (Signed)
[facility] Provider Note    Event Date/Time   First MD Initiated Contact with Patient 01/01/23 1133     (approximate)   History   Abdominal Pain   HPI  Kelli Farrell is a 42 year old female with history of PCKD presenting with nausea and 1 episode of vomiting and concerns of possible pregnancy.  Patient reports a history of a prior ectopic pregnancy for which she had her right fallopian tube removed, but still has her ovaries and is interested in getting pregnant.  Yesterday, she had nausea throughout the day but was tolerating p.o. intake.  Today she had an episode of vomiting.  She is concerned that she may be pregnant and reports that urine test had previously been an accurate for her leading to ER presentation.  She reports that she had some mild left lower quadrant cramping yesterday, but says that this is completely resolved.  No fevers or chills.  Denies dysuria.    Physical Exam   Triage Vital Signs: ED Triage Vitals  Encounter Vitals Group     BP 01/01/23 1131 (!) 169/121     Systolic BP Percentile --      Diastolic BP Percentile --      Pulse Rate 01/01/23 1128 79     Resp 01/01/23 1128 18     Temp 01/01/23 1128 97.9 F (36.6 C)     Temp Source 01/01/23 1128 Oral     SpO2 01/01/23 1128 100 %     Weight 01/01/23 1129 214 lb 1.1 oz (97.1 kg)     Height 01/01/23 1129 6' (1.829 m)     Head Circumference --      Peak Flow --      Pain Score 01/01/23 1129 8     Pain Loc --      Pain Education --      Exclude from Growth Chart --     Most recent vital signs: Vitals:   01/01/23 1128 01/01/23 1131  BP:  (!) 169/121  Pulse: 79   Resp: 18   Temp: 97.9 F (36.6 C)   SpO2: 100%      General: Awake, interactive  CV:  Regular rate, good peripheral perfusion.  Resp:  Lungs clear, unlabored respirations.  Abd:  Soft, nondistended, nontender to palpation throughout the abdomen Neuro:  Symmetric facial movement, fluid speech   ED Results  / Procedures / Treatments   Labs (all labs ordered are listed, but only abnormal results are displayed) Labs Reviewed  URINALYSIS, ROUTINE W REFLEX MICROSCOPIC - Abnormal; Notable for the following components:      Result Value   Color, Urine STRAW (*)    APPearance HAZY (*)    Leukocytes,Ua TRACE (*)    Bacteria, UA RARE (*)    All other components within normal limits  COMPREHENSIVE METABOLIC PANEL - Abnormal; Notable for the following components:   AST 13 (*)    Total Bilirubin <0.1 (*)    All other components within normal limits  CBC  HCG, QUANTITATIVE, PREGNANCY  LIPASE, BLOOD  POC URINE PREG, ED     EKG EKG independently reviewed interpreted by myself (ER attending) demonstrates:    RADIOLOGY Imaging independently reviewed and interpreted by myself demonstrates:    PROCEDURES:  Critical Care performed: No  Procedures   MEDICATIONS ORDERED IN ED: Medications  ondansetron (ZOFRAN-ODT) disintegrating tablet 4 mg (4 mg Oral Given 01/01/23 1151)     IMPRESSION / MDM / ASSESSMENT AND PLAN /  ED COURSE  I reviewed the triage vital signs and the nursing notes.  Differential diagnosis includes, but is not limited to, pregnancy, lower suspicion for significant acute intra-abdominal process given reassuring abdominal exam, and some duration for viral GI illness, UTI  Patient's presentation is most consistent with acute complicated illness / injury requiring diagnostic workup.  42 year old female presenting to the emergency department for evaluation of nausea and vomiting.  Labs without severe derangement.  UPT negative and beta hCG undetectable.  With reassuring exam, do not think there is an indication for imaging currently.  Patient reevaluated reports significant proved after Zofran.  She is comfortable with discharge home.  Strict return precautions provided.  Patient discharged in stable condition.     FINAL CLINICAL IMPRESSION(S) / ED DIAGNOSES   Final  diagnoses:  Nausea and vomiting, unspecified vomiting type     Rx / DC Orders   ED Discharge Orders          Ordered    ondansetron (ZOFRAN) 4 MG tablet  Every 6 hours PRN        01/01/23 1303             Note:  This document was prepared using Dragon voice recognition software and may include unintentional dictation errors.   Trinna Post, MD 01/01/23 [phone]

## 2023-01-04 NOTE — Group Note (Deleted)

## 2023-02-01 ENCOUNTER — Other Ambulatory Visit: Payer: Self-pay

## 2023-02-01 ENCOUNTER — Emergency Department
Admission: EM | Admit: 2023-02-01 | Discharge: 2023-02-01 | Disposition: A | Discharge status: Home / Self Care | Payer: 59 | Attending: Emergency Medicine | Admitting: Emergency Medicine

## 2023-02-01 DIAGNOSIS — M25562 Pain in left knee: Secondary | ICD-10-CM

## 2023-02-01 DIAGNOSIS — M7042 Prepatellar bursitis, left knee: Secondary | ICD-10-CM | POA: Insufficient documentation

## 2023-02-01 DIAGNOSIS — Y9389 Activity, other specified: Secondary | ICD-10-CM | POA: Diagnosis not present

## 2023-02-01 MED ORDER — SULFAMETHOXAZOLE-TRIMETHOPRIM 800-160 MG PO TABS: 1.0000 | ORAL_TABLET | Freq: Two times a day (BID) | ORAL | 0 refills | Status: AC

## 2023-02-01 MED ORDER — MELOXICAM 15 MG PO TABS: 15.0000 mg | ORAL_TABLET | Freq: Every day | ORAL | 0 refills | Status: AC

## 2023-02-01 NOTE — ED Triage Notes (Signed)
Pt to ED for left leg pain for years. Also reports nasty metal taste in mouth with drinking water. No obvious injuries noted.

## 2023-02-01 NOTE — ED Provider Notes (Signed)
[facility] Provider Note   Event Date/Time   First MD Initiated Contact with Patient 02/01/23 1004     (approximate) History  Leg Pain  HPI Dniyah Grant is a 42 y.o. female with a stated past medical history of eczema who presents complaining of of erythema and pain to the left knee.  Patient states that this occurs intermittently over the last few years and usually goes away on its own however patient states that this time the pain has been significant.  Patient denies any objective fevers but states that she has been feeling warm and chilled on occasion.  Patient denies any history of IV drug use or type 2 diabetes ROS: Patient currently denies any vision changes, tinnitus, difficulty speaking, facial droop, sore throat, chest pain, shortness of breath, abdominal pain, nausea/vomiting/diarrhea, dysuria, or weakness/numbness/paresthesias in any extremity   Physical Exam  Triage Vital Signs: ED Triage Vitals  Encounter Vitals Group     BP 02/01/23 0952 (!) 145/95     Systolic BP Percentile --      Diastolic BP Percentile --      Pulse Rate 02/01/23 0951 77     Resp 02/01/23 0951 20     Temp 02/01/23 0951 97.7 F (36.5 C)     Temp src --      SpO2 02/01/23 0951 100 %     Weight 02/01/23 0951 280 lb (127 kg)     Height 02/01/23 0951 5\' 11"  (1.803 m)     Head Circumference --      Peak Flow --      Pain Score 02/01/23 0951 10     Pain Loc --      Pain Education --      Exclude from Growth Chart --    Most recent vital signs: Vitals:   02/01/23 0951 02/01/23 0952  BP:  (!) 145/95  Pulse: 77   Resp: 20   Temp: 97.7 F (36.5 C)   SpO2: 100%    General: Awake, oriented x4. CV:  Good peripheral perfusion.  Resp:  Normal effort.  Abd:  No distention.  Other:  Middle-aged overweight Caucasian female resting comfortably in no acute distress.  Erythema and swelling to the prepatellar space.  Bedside ultrasound shows swelling of the prepatellar bursa  with tenderness to palpation.  No tenderness at the joint line and no significant joint effusion appreciated on ultrasound ED Results / Procedures / Treatments  Labs (all labs ordered are listed, but only abnormal results are displayed) Labs Reviewed - No data to display PROCEDURES: Critical Care performed: No Procedures MEDICATIONS ORDERED IN ED: Medications - No data to display IMPRESSION / MDM / ASSESSMENT AND PLAN / ED COURSE  I reviewed the triage vital signs and the nursing notes.                             The patient is on the cardiac monitor to evaluate for evidence of arrhythmia and/or significant heart rate changes. Patient's presentation is most consistent with acute presentation with potential threat to life or bodily function. Patient is a 42 year old female with history of eczema who presents for recurrent left knee pain and swelling Given history, exam and workup I have low suspicion for fracture, dislocation, significant ligamentous injury, septic arthritis, gout flare, new autoimmune arthropathy, or gonococcal arthropathy.  Interventions: Bedside ultrasound showing only swollen prepatellar bursa concerning for prepatellar bursitis Given the overlying  erythema however without fever, will treat empirically for possible infected bursitis with Bactrim.  Patient encouraged to follow-up with her primary care physician in the next 1-3 days for further evaluation and management Disposition: Discharge home with strict return precautions and instructions for prompt primary care follow up in the next week.   FINAL CLINICAL IMPRESSION(S) / ED DIAGNOSES   Final diagnoses:  Acute pain of left knee  Prepatellar bursitis of left knee   Rx / DC Orders   ED Discharge Orders          Ordered    sulfamethoxazole-trimethoprim (BACTRIM DS) 800-160 MG tablet  2 times daily        02/01/23 1036    meloxicam (MOBIC) 15 MG tablet  Daily        02/01/23 1036           Note:  This  document was prepared using Dragon voice recognition software and may include unintentional dictation errors.   Merwyn Katos, MD 02/01/23 [phone]

## 2023-02-01 NOTE — ED Notes (Signed)
See triage notes. Patient c/o left leg pain that has lasted for years and also a nasty metal taste after drinking water.

## 2023-03-03 ENCOUNTER — Emergency Department: Payer: 59

## 2023-03-03 ENCOUNTER — Encounter: Payer: Self-pay | Admitting: Intensive Care

## 2023-03-03 ENCOUNTER — Other Ambulatory Visit: Payer: Self-pay

## 2023-03-03 ENCOUNTER — Emergency Department
Admission: EM | Admit: 2023-03-03 | Discharge: 2023-03-03 | Disposition: A | Discharge status: Home / Self Care | Payer: 59 | Attending: Emergency Medicine | Admitting: Emergency Medicine

## 2023-03-03 DIAGNOSIS — J4521 Mild intermittent asthma with (acute) exacerbation: Secondary | ICD-10-CM

## 2023-03-03 DIAGNOSIS — Z20822 Contact with and (suspected) exposure to covid-19: Secondary | ICD-10-CM | POA: Diagnosis not present

## 2023-03-03 DIAGNOSIS — J069 Acute upper respiratory infection, unspecified: Secondary | ICD-10-CM

## 2023-03-03 DIAGNOSIS — E876 Hypokalemia: Secondary | ICD-10-CM | POA: Diagnosis not present

## 2023-03-03 DIAGNOSIS — F17200 Nicotine dependence, unspecified, uncomplicated: Secondary | ICD-10-CM | POA: Insufficient documentation

## 2023-03-03 DIAGNOSIS — B9789 Other viral agents as the cause of diseases classified elsewhere: Secondary | ICD-10-CM | POA: Insufficient documentation

## 2023-03-03 DIAGNOSIS — R0602 Shortness of breath: Secondary | ICD-10-CM | POA: Diagnosis present

## 2023-03-03 LAB — URINALYSIS, W/ REFLEX TO CULTURE (INFECTION SUSPECTED)
Bacteria, UA: NONE SEEN
Bilirubin Urine: NEGATIVE
Glucose, UA: NEGATIVE mg/dL
Ketones, ur: NEGATIVE mg/dL
Leukocytes,Ua: NEGATIVE
Nitrite: NEGATIVE
Protein, ur: 100 mg/dL — AB
Specific Gravity, Urine: 1.024 (ref 1.005–1.030)
pH: 5 (ref 5.0–8.0)

## 2023-03-03 LAB — CBC WITH DIFFERENTIAL/PLATELET
Abs Immature Granulocytes: 0.02 10*3/uL (ref 0.00–0.07)
Basophils Absolute: 0 10*3/uL (ref 0.0–0.1)
Basophils Relative: 0 %
Eosinophils Absolute: 0 10*3/uL (ref 0.0–0.5)
Eosinophils Relative: 1 %
HCT: 41.1 % (ref 36.0–46.0)
Hemoglobin: 13.9 g/dL (ref 12.0–15.0)
Immature Granulocytes: 0 %
Lymphocytes Relative: 11 %
Lymphs Abs: 0.8 10*3/uL (ref 0.7–4.0)
MCH: 31.5 pg (ref 26.0–34.0)
MCHC: 33.8 g/dL (ref 30.0–36.0)
MCV: 93.2 fL (ref 80.0–100.0)
Monocytes Absolute: 0.6 10*3/uL (ref 0.1–1.0)
Monocytes Relative: 8 %
Neutro Abs: 5.7 10*3/uL (ref 1.7–7.7)
Neutrophils Relative %: 80 %
Platelets: 180 10*3/uL (ref 150–400)
RBC: 4.41 MIL/uL (ref 3.87–5.11)
RDW: 11.4 % — ABNORMAL LOW (ref 11.5–15.5)
WBC: 7.1 10*3/uL (ref 4.0–10.5)
nRBC: 0.3 % — ABNORMAL HIGH (ref 0.0–0.2)

## 2023-03-03 LAB — COMPREHENSIVE METABOLIC PANEL
ALT: 18 U/L (ref 0–44)
AST: 21 U/L (ref 15–41)
Albumin: 4.1 g/dL (ref 3.5–5.0)
Alkaline Phosphatase: 50 U/L (ref 38–126)
Anion gap: 11 (ref 5–15)
BUN: 14 mg/dL (ref 6–20)
CO2: 25 mmol/L (ref 22–32)
Calcium: 8.8 mg/dL — ABNORMAL LOW (ref 8.9–10.3)
Chloride: 102 mmol/L (ref 98–111)
Creatinine, Ser: 0.86 mg/dL (ref 0.44–1.00)
GFR, Estimated: >60 mL/min (ref >60)
Glucose, Bld: 115 mg/dL — ABNORMAL HIGH (ref 70–99)
Potassium: 2.6 mmol/L — CL (ref 3.5–5.1)
Sodium: 138 mmol/L (ref 135–145)
Total Bilirubin: 0.4 mg/dL (ref <1.2)
Total Protein: 7.4 g/dL (ref 6.5–8.1)

## 2023-03-03 LAB — MAGNESIUM: Magnesium: 2 mg/dL (ref 1.7–2.4)

## 2023-03-03 LAB — LIPASE, BLOOD: Lipase: 24 U/L (ref 11–51)

## 2023-03-03 LAB — POC URINE PREG, ED: Preg Test, Ur: NEGATIVE

## 2023-03-03 LAB — RESP PANEL BY RT-PCR (RSV, FLU A&B, COVID)  RVPGX2
Influenza A by PCR: NEGATIVE
Influenza B by PCR: NEGATIVE
Resp Syncytial Virus by PCR: NEGATIVE
SARS Coronavirus 2 by RT PCR: NEGATIVE

## 2023-03-03 MED ORDER — IPRATROPIUM-ALBUTEROL 0.5-2.5 (3) MG/3ML IN SOLN
6.0000 mL | Freq: Once | RESPIRATORY_TRACT | Status: AC
  Administered 2023-03-03: 6 mL via RESPIRATORY_TRACT
  Filled 2023-03-03: qty 6

## 2023-03-03 MED ORDER — PREDNISONE 50 MG PO TABS: 50.0000 mg | ORAL_TABLET | Freq: Every day | ORAL | 0 refills | Status: DC

## 2023-03-03 MED ORDER — NICOTINE 7 MG/24HR TD PT24
7.0000 mg | MEDICATED_PATCH | Freq: Every day | TRANSDERMAL | 0 refills | Status: AC
Start: 2023-03-03 — End: 2024-03-02

## 2023-03-03 MED ORDER — ALBUTEROL SULFATE HFA 108 (90 BASE) MCG/ACT IN AERS: 2.0000 | INHALATION_SPRAY | Freq: Four times a day (QID) | RESPIRATORY_TRACT | 2 refills | Status: DC | PRN

## 2023-03-03 MED ORDER — NICOTINE POLACRILEX 4 MG MT LOZG: 4.0000 mg | LOZENGE | OROMUCOSAL | 0 refills | Status: AC | PRN

## 2023-03-03 MED ORDER — PREDNISONE 20 MG PO TABS
60.0000 mg | ORAL_TABLET | Freq: Once | ORAL | Status: AC
  Administered 2023-03-03: 60 mg via ORAL
  Filled 2023-03-03: qty 3

## 2023-03-03 MED ORDER — ONDANSETRON HCL 4 MG PO TABS: 4.0000 mg | ORAL_TABLET | Freq: Every day | ORAL | 1 refills | Status: AC | PRN

## 2023-03-03 MED ORDER — POTASSIUM CHLORIDE CRYS ER 20 MEQ PO TBCR
80.0000 meq | EXTENDED_RELEASE_TABLET | Freq: Once | ORAL | Status: AC
  Administered 2023-03-03: 80 meq via ORAL
  Filled 2023-03-03: qty 4

## 2023-03-03 NOTE — ED Notes (Signed)
Pt verbalized dc instructions

## 2023-03-03 NOTE — ED Provider Notes (Addendum)
[facility] Provider Note    Event Date/Time   First MD Initiated Contact with Patient 03/03/23 [phone]     (approximate)   History   Shortness of Breath and Emesis   HPI  Kelli Farrell is a 42 y.o. female   Past medical history of current smoker who presents to the emergency department with 3 days of productive cough, chest congestion, nausea, poor p.o. intake, lightheadedness.  Shortness of breath.  Subjective fever/chills.  Denies chest pain.  No abdominal pain or urinary symptoms.    External Medical Documents Reviewed: [facility] clinical summary for past medical history and medications      Physical Exam   Triage Vital Signs: ED Triage Vitals  Encounter Vitals Group     BP 03/03/23 0812 (!) 164/84     Systolic BP Percentile --      Diastolic BP Percentile --      Pulse Rate 03/03/23 0812 100     Resp 03/03/23 0812 (!) 22     Temp 03/03/23 0812 99.1 F (37.3 C)     Temp Source 03/03/23 0812 Oral     SpO2 03/03/23 0812 100 %     Weight 03/03/23 0813 279 lb 15.8 oz (127 kg)     Height 03/03/23 0813 5\' 11"  (1.803 m)     Head Circumference --      Peak Flow --      Pain Score 03/03/23 0812 8     Pain Loc --      Pain Education --      Exclude from Growth Chart --     Most recent vital signs: Vitals:   03/03/23 0850 03/03/23 0900  BP:  (!) 146/86  Pulse:  100  Resp:    Temp:    SpO2: 98% 97%    General: Awake, no distress.  CV:  Good peripheral perfusion.  Resp:  Normal effort.  Abd:  No distention.  Other:  No respiratory distress distress but wheezing throughout.  No focalities.  Soft nontender abdomen.  Appears euvolemic.   ED Results / Procedures / Treatments   Labs (all labs ordered are listed, but only abnormal results are displayed) Labs Reviewed  CBC WITH DIFFERENTIAL/PLATELET - Abnormal; Notable for the following components:      Result Value   RDW 11.4 (*)    nRBC 0.3 (*)    All other components  within normal limits  COMPREHENSIVE METABOLIC PANEL - Abnormal; Notable for the following components:   Potassium 2.6 (*)    Glucose, Bld 115 (*)    Calcium 8.8 (*)    All other components within normal limits  URINALYSIS, W/ REFLEX TO CULTURE (INFECTION SUSPECTED) - Abnormal; Notable for the following components:   Color, Urine YELLOW (*)    APPearance HAZY (*)    Hgb urine dipstick SMALL (*)    Protein, ur 100 (*)    All other components within normal limits  RESP PANEL BY RT-PCR (RSV, FLU A&B, COVID)  RVPGX2  LIPASE, BLOOD  MAGNESIUM  POC URINE PREG, ED     I ordered and reviewed the above labs they are notable for white blood cell count is normal, potassium is low at 2.6.  EKG  ED ECG REPORT I, Pilar Jarvis, the attending physician, personally viewed and interpreted this ECG.   Date: 03/03/2023  EKG Time: 0819  Rate: 86  Rhythm: sinus  Axis: nl  Intervals:none  ST&T Change: no stemi  RADIOLOGY I independently reviewed and interpreted chest x-ray and I see no obvious focalities or pneumothorax I also reviewed radiologist's formal read.   PROCEDURES:  Critical Care performed: No  Procedures   MEDICATIONS ORDERED IN ED: Medications  potassium chloride SA (KLOR-CON M) CR tablet 80 mEq (has no administration in time range)  ipratropium-albuterol (DUONEB) 0.5-2.5 (3) MG/3ML nebulizer solution 6 mL (has no administration in time range)  predniSONE (DELTASONE) tablet 60 mg (has no administration in time range)    IMPRESSION / MDM / ASSESSMENT AND PLAN / ED COURSE  I reviewed the triage vital signs and the nursing notes.                                Patient's presentation is most consistent with acute presentation with potential threat to life or bodily function.  Differential diagnosis includes, but is not limited to, viral URI, bacterial pneumonia, COPD/asthma exacerbation, electrolyte disturbance, AKI, dehydration, considered but less likely  intra-abdominal infection or obstruction, PE, ACS   The patient is on the cardiac monitor to evaluate for evidence of arrhythmia and/or significant heart rate changes.  MDM:    Smoker with wheezing and viral URI symptoms over the past several days.  Will treat with DuoNebs and steroids.  Suspect underlying asthma/mild COPD in this long-term smoker.  No focalities on clinical exam to suggest bacterial pneumonia, no leukocytosis, no fever and no respiratory distress.  Will get a chest x-ray and treat only if there is focal opacities representing bacterial pneumonia otherwise defer antibiotic treatment for likely viral illness.  Nausea, vomiting, likely explains her degree of hypokalemia with no EKG changes.  Repletion ordered in the emergency department as well as antiemetic here and outpatient prescription as well.  Soft benign abdominal exam rules against surgical abdominal pathologies at this time.  I considered PE but I doubt in the setting of suspected viral URI and clinical evidence of asthma/COPD exacerbation and wheezing.  I considered hospitalization for admission or observation however given her overall stability, response to treatment, I think that outpatient treatment and follow-up was appropriate at this time.  She understands return with any new worsening symptoms  -- I spent 5 minutes counseling this patient on smoking cessation.  We spoke about the patient's current tobacco use, impact of smoking, assessed willingness to quit, methods for cessation including medical management and nicotine replacement therapy (which I prescribed to the patient) and advised follow-up with primary doctor to continue to address smoking cessation.       FINAL CLINICAL IMPRESSION(S) / ED DIAGNOSES   Final diagnoses:  Mild intermittent asthma with exacerbation  Viral URI with cough  Hypokalemia     Rx / DC Orders   ED Discharge Orders          Ordered    predniSONE (DELTASONE) 50 MG tablet   Daily        03/03/23 0923    albuterol (VENTOLIN HFA) 108 (90 Base) MCG/ACT inhaler  Every 6 hours PRN        03/03/23 0923    nicotine (NICODERM CQ - DOSED IN MG/24 HR) 7 mg/24hr patch  Daily        03/03/23 0923    nicotine polacrilex (NICOTINE MINI) 4 MG lozenge  As needed        03/03/23 0923    ondansetron (ZOFRAN) 4 MG tablet  Daily PRN  03/03/23 0981             Note:  This document was prepared using Dragon voice recognition software and may include unintentional dictation errors.    Pilar Jarvis, MD 03/03/23 1914    Pilar Jarvis, MD 03/03/23 [phone]

## 2023-03-03 NOTE — ED Triage Notes (Addendum)
Patient c/o sob, emesis, and feeling lightheaded. Reports symptoms started Sunday. Reports pain when coughing

## 2023-03-03 NOTE — Discharge Instructions (Addendum)
Take albuterol inhaler as prescribed.  Take prednisone as prescribed.  Your potassium was low.  You got some repletion in the emergency department.  See the attached documents for low potassium information and for dietary changes that you can use to increase your potassium intake, and recheck these levels with your doctor at your next appointment.  Thank you for choosing [place] for your health care today!  Please see your primary doctor this week for a follow up appointment.   If you have any new, worsening, or unexpected symptoms call your doctor right away or come back to the emergency department for reevaluation.  It was my pleasure to care for you today.   Daneil Dan Modesto Charon, MD

## 2023-03-07 ENCOUNTER — Emergency Department: Payer: 59

## 2023-03-07 ENCOUNTER — Emergency Department
Admission: EM | Admit: 2023-03-07 | Discharge: 2023-03-07 | Disposition: A | Discharge status: Home / Self Care | Payer: 59 | Attending: Emergency Medicine | Admitting: Emergency Medicine

## 2023-03-07 ENCOUNTER — Other Ambulatory Visit: Payer: Self-pay

## 2023-03-07 DIAGNOSIS — R0602 Shortness of breath: Secondary | ICD-10-CM | POA: Diagnosis present

## 2023-03-07 DIAGNOSIS — F17200 Nicotine dependence, unspecified, uncomplicated: Secondary | ICD-10-CM | POA: Insufficient documentation

## 2023-03-07 DIAGNOSIS — J189 Pneumonia, unspecified organism: Secondary | ICD-10-CM | POA: Diagnosis not present

## 2023-03-07 LAB — CBC WITH DIFFERENTIAL/PLATELET
Abs Immature Granulocytes: 0.02 10*3/uL (ref 0.00–0.07)
Basophils Absolute: 0 10*3/uL (ref 0.0–0.1)
Basophils Relative: 0 %
Eosinophils Absolute: 0 10*3/uL (ref 0.0–0.5)
Eosinophils Relative: 0 %
HCT: 38 % (ref 36.0–46.0)
Hemoglobin: 12.9 g/dL (ref 12.0–15.0)
Immature Granulocytes: 0 %
Lymphocytes Relative: 7 %
Lymphs Abs: 0.5 10*3/uL — ABNORMAL LOW (ref 0.7–4.0)
MCH: 31 pg (ref 26.0–34.0)
MCHC: 33.9 g/dL (ref 30.0–36.0)
MCV: 91.3 fL (ref 80.0–100.0)
Monocytes Absolute: 0.2 10*3/uL (ref 0.1–1.0)
Monocytes Relative: 3 %
Neutro Abs: 6.4 10*3/uL (ref 1.7–7.7)
Neutrophils Relative %: 90 %
Platelets: 269 10*3/uL (ref 150–400)
RBC: 4.16 MIL/uL (ref 3.87–5.11)
RDW: 11.4 % — ABNORMAL LOW (ref 11.5–15.5)
WBC: 7.1 10*3/uL (ref 4.0–10.5)
nRBC: 0 % (ref 0.0–0.2)

## 2023-03-07 LAB — TROPONIN I (HIGH SENSITIVITY): Troponin I (High Sensitivity): 4 ng/L (ref <18)

## 2023-03-07 LAB — D-DIMER, QUANTITATIVE: D-Dimer, Quant: 0.46 ug{FEU}/mL (ref 0.00–0.50)

## 2023-03-07 LAB — COMPREHENSIVE METABOLIC PANEL
ALT: 20 U/L (ref 0–44)
AST: 15 U/L (ref 15–41)
Albumin: 3.7 g/dL (ref 3.5–5.0)
Alkaline Phosphatase: 52 U/L (ref 38–126)
Anion gap: 10 (ref 5–15)
BUN: 15 mg/dL (ref 6–20)
CO2: 23 mmol/L (ref 22–32)
Calcium: 8.9 mg/dL (ref 8.9–10.3)
Chloride: 104 mmol/L (ref 98–111)
Creatinine, Ser: 0.92 mg/dL (ref 0.44–1.00)
GFR, Estimated: >60 mL/min (ref >60)
Glucose, Bld: 211 mg/dL — ABNORMAL HIGH (ref 70–99)
Potassium: 3.3 mmol/L — ABNORMAL LOW (ref 3.5–5.1)
Sodium: 137 mmol/L (ref 135–145)
Total Bilirubin: 0.3 mg/dL (ref <1.2)
Total Protein: 7.3 g/dL (ref 6.5–8.1)

## 2023-03-07 MED ORDER — IPRATROPIUM-ALBUTEROL 0.5-2.5 (3) MG/3ML IN SOLN
3.0000 mL | Freq: Once | RESPIRATORY_TRACT | Status: AC
  Administered 2023-03-07: 3 mL via RESPIRATORY_TRACT
  Filled 2023-03-07: qty 3

## 2023-03-07 MED ORDER — HYDROCOD POLI-CHLORPHE POLI ER 10-8 MG/5ML PO SUER
5.0000 mL | Freq: Once | ORAL | Status: AC
  Administered 2023-03-07: 5 mL via ORAL
  Filled 2023-03-07: qty 5

## 2023-03-07 MED ORDER — DOXYCYCLINE HYCLATE 100 MG PO TABS: 100.0000 mg | ORAL_TABLET | Freq: Two times a day (BID) | ORAL | 0 refills | Status: DC

## 2023-03-07 MED ORDER — HYDROCOD POLI-CHLORPHE POLI ER 10-8 MG/5ML PO SUER: 5.0000 mL | Freq: Two times a day (BID) | ORAL | 0 refills | Status: DC | PRN

## 2023-03-07 MED ORDER — SODIUM CHLORIDE 0.9 % IV SOLN
1.0000 g | Freq: Once | INTRAVENOUS | Status: AC
  Administered 2023-03-07: 1 g via INTRAVENOUS
  Filled 2023-03-07: qty 10

## 2023-03-07 MED ORDER — DEXAMETHASONE SODIUM PHOSPHATE 10 MG/ML IJ SOLN
10.0000 mg | Freq: Once | INTRAMUSCULAR | Status: AC
  Administered 2023-03-07: 10 mg via INTRAVENOUS
  Filled 2023-03-07: qty 1

## 2023-03-07 MED ORDER — BUDESONIDE-FORMOTEROL FUMARATE 160-4.5 MCG/ACT IN AERO: 2.0000 | INHALATION_SPRAY | Freq: Two times a day (BID) | RESPIRATORY_TRACT | 12 refills | Status: AC

## 2023-03-07 MED ORDER — IPRATROPIUM-ALBUTEROL 0.5-2.5 (3) MG/3ML IN SOLN: 3.0000 mL | RESPIRATORY_TRACT | 1 refills | Status: AC | PRN

## 2023-03-07 MED ORDER — IOHEXOL 300 MG/ML  SOLN
75.0000 mL | Freq: Once | INTRAMUSCULAR | Status: AC | PRN
  Administered 2023-03-07: 75 mL via INTRAVENOUS

## 2023-03-07 NOTE — ED Provider Notes (Signed)
[facility] Provider Note    Event Date/Time   First MD Initiated Contact with Patient 03/07/23 1928     (approximate)   History   URI   HPI  Preya Stoneburg is a 42 y.o. female with no significant past medical history presents emergency department with complaints of continued difficulty breathing.  Patient was seen on November 6 placed on prednisone and albuterol.  She was given albuterol inhaler and has finished the inhaler if she has had to use it that many times.  She is not getting any better.  Is feeling worse and cannot sleep at night due to difficulty breathing.  She is having some sweats and cough.  Unknown if she has had fever.  Positive smoker      Physical Exam   Triage Vital Signs: ED Triage Vitals  Encounter Vitals Group     BP 03/07/23 1759 (!) 175/95     Systolic BP Percentile --      Diastolic BP Percentile --      Pulse Rate 03/07/23 1759 96     Resp 03/07/23 1759 20     Temp 03/07/23 1759 98.6 F (37 C)     Temp src --      SpO2 03/07/23 1759 99 %     Weight --      Height --      Head Circumference --      Peak Flow --      Pain Score 03/07/23 1758 5     Pain Loc --      Pain Education --      Exclude from Growth Chart --     Most recent vital signs: Vitals:   03/07/23 1759  BP: (!) 175/95  Pulse: 96  Resp: 20  Temp: 98.6 F (37 C)  SpO2: 99%     General: Awake, no distress.   CV:  Good peripheral perfusion. regular rate and  rhythm Resp:   Lungs with some anterior wheezing, increased effort with respirations, patient can talk in complete sentences Abd:  No distention.   Other:  No pedal edema noted   ED Results / Procedures / Treatments   Labs (all labs ordered are listed, but only abnormal results are displayed) Labs Reviewed  COMPREHENSIVE METABOLIC PANEL - Abnormal; Notable for the following components:      Result Value   Potassium 3.3 (*)    Glucose, Bld 211 (*)    All other components within  normal limits  CBC WITH DIFFERENTIAL/PLATELET - Abnormal; Notable for the following components:   RDW 11.4 (*)    Lymphs Abs 0.5 (*)    All other components within normal limits  D-DIMER, QUANTITATIVE  TROPONIN I (HIGH SENSITIVITY)  TROPONIN I (HIGH SENSITIVITY)     EKG     RADIOLOGY Chest x-ray, CT of the chest    PROCEDURES:   Procedures   MEDICATIONS ORDERED IN ED: Medications  cefTRIAXone (ROCEPHIN) 1 g in sodium chloride 0.9 % 100 mL IVPB (1 g Intravenous New Bag/Given 03/07/23 2149)  dexamethasone (DECADRON) injection 10 mg (10 mg Intravenous Given 03/07/23 1855)  ipratropium-albuterol (DUONEB) 0.5-2.5 (3) MG/3ML nebulizer solution 3 mL (3 mLs Nebulization Given 03/07/23 1854)  iohexol (OMNIPAQUE) 300 MG/ML solution 75 mL (75 mLs Intravenous Contrast Given 03/07/23 2011)  chlorpheniramine-HYDROcodone (TUSSIONEX) 10-8 MG/5ML suspension 5 mL (5 mLs Oral Given 03/07/23 2154)     IMPRESSION / MDM / ASSESSMENT AND PLAN / ED COURSE  I reviewed  the triage vital signs and the nursing notes.                              Differential diagnosis includes, but is not limited to, CAP, respiratory distress, acute bronchitis, COVID, influenza, lung cancer, PE, CHF  Patient's presentation is most consistent with acute illness / injury with system symptoms.   CBC metabolic panel troponin and D-dimer are are all reassuring  Work score is negative for PE  Chest x-ray shows questionable atelectasis versus infiltrate.  Due to the patient continued to have so many difficulties with breathing and has gone through an entire inhaler in the past few days we will get a CT of the chest  DuoNeb given here in the ED, patient had some relief with DuoNeb but is still breathing rapidly   Patient had a little relief with the DuoNeb treatment.  Continues to cough.  CT of the chest, independently reviewed interpreted by me as being possible for pneumonia along with patient having a lot of  lymphadenopathy around the mediastinum.  I did inform the patient of these results.  Feel she needs to have a follow-up with her doctor.  Since she is a smoker have concerns about the lymphadenopathy.  Hopefully this is just reactive due to the possible infection.  Since she has already gone through a whole inhaler of albuterol we will switch her over to Symbicort twice daily.  DuoNeb Nebules with the DME nebulizer machine every 4-6 hours.  Tussionex for the cough.  Doxycycline for infection.  She was given Rocephin 1 g IV while here in the ED.  Did consider admission.  However the patient's labs are reassuring CT is reassuring and feel that she had some improvement with the breathing treatment.  Therefore I think she is stable to go home.  She is in agreement with treatment plan.  Discharged in stable condition.   FINAL CLINICAL IMPRESSION(S) / ED DIAGNOSES   Final diagnoses:  Community acquired pneumonia, unspecified laterality     Rx / DC Orders   ED Discharge Orders          Ordered    doxycycline (VIBRA-TABS) 100 MG tablet  2 times daily        03/07/23 2136    budesonide-formoterol (SYMBICORT) 160-4.5 MCG/ACT inhaler  2 times daily        03/07/23 2136    ipratropium-albuterol (DUONEB) 0.5-2.5 (3) MG/3ML SOLN  Every 4 hours PRN        03/07/23 2136    For home use only DME Nebulizer machine        03/07/23 2153    chlorpheniramine-HYDROcodone (TUSSIONEX) 10-8 MG/5ML  Every 12 hours PRN        03/07/23 2153             Note:  This document was prepared using Dragon voice recognition software and may include unintentional dictation errors.    Faythe Ghee, PA-C 03/07/23 2159    Minna Antis, MD 03/07/23 2252

## 2023-03-07 NOTE — ED Triage Notes (Signed)
Pt comes with c/o URI. Pt states she was dx with one on the 6th. Pt states she was given medications and took last does today. Pt states she isn't getting better. Pt states she feels worse and can't sleep at night. Pt states sweats and cough. Pt denies any fevers.

## 2023-06-14 ENCOUNTER — Emergency Department: Payer: BLUE CROSS/BLUE SHIELD

## 2023-06-14 ENCOUNTER — Emergency Department
Admission: EM | Admit: 2023-06-14 | Discharge: 2023-06-14 | Disposition: A | Discharge status: Home / Self Care | Payer: BLUE CROSS/BLUE SHIELD | Attending: Emergency Medicine | Admitting: Emergency Medicine

## 2023-06-14 DIAGNOSIS — N83202 Unspecified ovarian cyst, left side: Secondary | ICD-10-CM | POA: Insufficient documentation

## 2023-06-14 DIAGNOSIS — D259 Leiomyoma of uterus, unspecified: Secondary | ICD-10-CM | POA: Insufficient documentation

## 2023-06-14 DIAGNOSIS — R1032 Left lower quadrant pain: Secondary | ICD-10-CM | POA: Diagnosis present

## 2023-06-14 LAB — CBC
HCT: 48.6 % — ABNORMAL HIGH (ref 36.0–46.0)
Hemoglobin: 16.4 g/dL — ABNORMAL HIGH (ref 12.0–15.0)
MCH: 31.2 pg (ref 26.0–34.0)
MCHC: 33.7 g/dL (ref 30.0–36.0)
MCV: 92.4 fL (ref 80.0–100.0)
Platelets: 218 10*3/uL (ref 150–400)
RBC: 5.26 MIL/uL — ABNORMAL HIGH (ref 3.87–5.11)
RDW: 11.5 % (ref 11.5–15.5)
WBC: 8.8 10*3/uL (ref 4.0–10.5)
nRBC: 0 % (ref 0.0–0.2)

## 2023-06-14 LAB — URINALYSIS, ROUTINE W REFLEX MICROSCOPIC
Bilirubin Urine: NEGATIVE
Glucose, UA: NEGATIVE mg/dL
Hgb urine dipstick: NEGATIVE
Ketones, ur: NEGATIVE mg/dL
Nitrite: NEGATIVE
Protein, ur: NEGATIVE mg/dL
Specific Gravity, Urine: 1.003 — ABNORMAL LOW (ref 1.005–1.030)
pH: 6 (ref 5.0–8.0)

## 2023-06-14 LAB — COMPREHENSIVE METABOLIC PANEL
ALT: 14 U/L (ref 0–44)
AST: 15 U/L (ref 15–41)
Albumin: 4.4 g/dL (ref 3.5–5.0)
Alkaline Phosphatase: 62 U/L (ref 38–126)
Anion gap: 11 (ref 5–15)
BUN: 20 mg/dL (ref 6–20)
CO2: 24 mmol/L (ref 22–32)
Calcium: 9.8 mg/dL (ref 8.9–10.3)
Chloride: 104 mmol/L (ref 98–111)
Creatinine, Ser: 0.83 mg/dL (ref 0.44–1.00)
GFR, Estimated: >60 mL/min (ref >60)
Glucose, Bld: 82 mg/dL (ref 70–99)
Potassium: 3.1 mmol/L — ABNORMAL LOW (ref 3.5–5.1)
Sodium: 139 mmol/L (ref 135–145)
Total Bilirubin: 0.3 mg/dL (ref 0.0–1.2)
Total Protein: 8.1 g/dL (ref 6.5–8.1)

## 2023-06-14 LAB — LIPASE, BLOOD: Lipase: 29 U/L (ref 11–51)

## 2023-06-14 LAB — POC URINE PREG, ED: Preg Test, Ur: NEGATIVE

## 2023-06-14 LAB — HCG, QUANTITATIVE, PREGNANCY: hCG, Beta Chain, Quant, S: <1 m[IU]/mL (ref <5)

## 2023-06-14 NOTE — ED Provider Notes (Signed)
[facility] Provider Note    Event Date/Time   First MD Initiated Contact with Patient 06/14/23 1743     (approximate)   History   Possible Pregnancy   HPI Kelli Farrell is a 43 y.o. female with prior history of polycystic ovarian syndrome, prior tubal pregnancy presenting today for left lower quadrant abdominal pain.  Patient states for the past 2 days she has had intermittent pinching sensation in her left lower abdomen.  Mild nausea but no vomiting.  States pain symptoms last for approximately 5 minutes and then resolved.  No obvious alleviating or aggravating factors.  Currently not having any pain.  States he had similar symptoms in the past when she had a tubal pregnancy.  Took home pregnancy tests which were equivocal.  Otherwise no diarrhea no constipation.  States it has been 60 days since her last period which is unusual for her.  No other vaginal discharge.  No dysuria or hematuria.     Physical Exam   Triage Vital Signs: ED Triage Vitals  Encounter Vitals Group     BP 06/14/23 1308 (!) 159/90     Systolic BP Percentile --      Diastolic BP Percentile --      Pulse Rate 06/14/23 1308 88     Resp 06/14/23 1308 17     Temp 06/14/23 1308 98 F (36.7 C)     Temp Source 06/14/23 1308 Oral     SpO2 06/14/23 1308 100 %     Weight 06/14/23 1309 248 lb (112.5 kg)     Height 06/14/23 1309 5\' 9"  (1.753 m)     Head Circumference --      Peak Flow --      Pain Score 06/14/23 1315 4     Pain Loc --      Pain Education --      Exclude from Growth Chart --     Most recent vital signs: Vitals:   06/14/23 1308 06/14/23 1930  BP: (!) 159/90 (!) 148/86  Pulse: 88 80  Resp: 17 18  Temp: 98 F (36.7 C) 98.2 F (36.8 C)  SpO2: 100% 99%   Physical Exam: I have reviewed the vital signs and nursing notes. General: Awake, alert, no acute distress.  Nontoxic appearing. Head:  Atraumatic, normocephalic.   ENT:  EOM intact, PERRL. Oral mucosa is pink  and moist with no lesions. Neck: Neck is supple with full range of motion, No meningeal signs. Cardiovascular:  RRR, No murmurs. Peripheral pulses palpable and equal bilaterally. Respiratory:  Symmetrical chest wall expansion.  No rhonchi, rales, or wheezes.  Good air movement throughout.  No use of accessory muscles.   Musculoskeletal:  No cyanosis or edema. Moving extremities with full ROM Abdomen:  Soft, nontender, nondistended. Neuro:  GCS 15, moving all four extremities, interacting appropriately. Speech clear. Psych:  Calm, appropriate.   Skin:  Warm, dry, no rash.    ED Results / Procedures / Treatments   Labs (all labs ordered are listed, but only abnormal results are displayed) Labs Reviewed  COMPREHENSIVE METABOLIC PANEL - Abnormal; Notable for the following components:      Result Value   Potassium 3.1 (*)    All other components within normal limits  CBC - Abnormal; Notable for the following components:   RBC 5.26 (*)    Hemoglobin 16.4 (*)    HCT 48.6 (*)    All other components within normal limits  URINALYSIS, ROUTINE W  REFLEX MICROSCOPIC - Abnormal; Notable for the following components:   Color, Urine COLORLESS (*)    APPearance CLEAR (*)    Specific Gravity, Urine 1.003 (*)    Leukocytes,Ua TRACE (*)    Bacteria, UA RARE (*)    All other components within normal limits  POC URINE PREG, ED - Normal  LIPASE, BLOOD  HCG, QUANTITATIVE, PREGNANCY     EKG    RADIOLOGY Independently interpreted transvaginal ultrasound showing evidence of left-sided ovarian cyst and uterine fibroid but no other concerning pathology   PROCEDURES:  Critical Care performed: No  Procedures   MEDICATIONS ORDERED IN ED: Medications - No data to display   IMPRESSION / MDM / ASSESSMENT AND PLAN / ED COURSE  I reviewed the triage vital signs and the nursing notes.                              Differential diagnosis includes, but is not limited to, pregnancy, ectopic  pregnancy, ovarian cyst, ovarian cyst rupture, ovarian torsion, UTI  Patient's presentation is most consistent with acute complicated illness / injury requiring diagnostic workup.  Patient is a 43 year old female presenting today for intermittent left lower quadrant pain, nausea, and questionable positive pregnancy test at home.  Vital signs are stable here and currently she does not have any pain symptoms with no tenderness in the left lower quadrant.  CBC unremarkable.  CMP with mild hypokalemia otherwise reassuring.  Both her urine and blood hCG test are negative not indicating pregnancy.  UA was equivocal but she does not have any dysuria symptoms so low suspicion for UTI at this time.  Transvaginal ultrasound was ordered which shows no evidence of torsion, cyst rupture, or other obvious acute concerning pathology.  She does have cyst on her left ovaries and a uterine fibroid.  Patient can safely follow-up with gynecology in their clinic for ongoing evaluation as needed.  She was agreeable with this plan and given strict return precautions.     FINAL CLINICAL IMPRESSION(S) / ED DIAGNOSES   Final diagnoses:  Cyst of left ovary  Uterine leiomyoma, unspecified location     Rx / DC Orders   ED Discharge Orders     None        Note:  This document was prepared using Dragon voice recognition software and may include unintentional dictation errors.   Janith Lima, MD 06/14/23 [phone]

## 2023-06-14 NOTE — ED Triage Notes (Signed)
Pt presents for a pregnancy test and L side abdominal pain, nausea, and breast itching x2 days.  Pain score 4/10.   Pt reports her menstrual cycle is 60 days late.  Pt reports "I'm having the pain like I did when I had my tubal."

## 2023-06-14 NOTE — Discharge Instructions (Signed)
Please follow-up with your gynecology team within the next week for reassessment.  Negative pregnancy test and ultrasound shows ovarian cyst on the left side and small fibroid on your uterus.  Gynecology can manage this in their clinic.  Please return for any severe worsening symptoms.

## 2023-12-16 ENCOUNTER — Emergency Department
Admission: EM | Admit: 2023-12-16 | Discharge: 2023-12-16 | Disposition: A | Discharge status: Home / Self Care | Attending: Emergency Medicine | Admitting: Emergency Medicine

## 2023-12-16 ENCOUNTER — Emergency Department

## 2023-12-16 DIAGNOSIS — R5383 Other fatigue: Secondary | ICD-10-CM | POA: Insufficient documentation

## 2023-12-16 DIAGNOSIS — M545 Low back pain, unspecified: Secondary | ICD-10-CM | POA: Diagnosis not present

## 2023-12-16 DIAGNOSIS — R519 Headache, unspecified: Secondary | ICD-10-CM | POA: Diagnosis present

## 2023-12-16 LAB — COMPREHENSIVE METABOLIC PANEL WITH GFR
ALT: 11 U/L (ref 0–44)
AST: 13 U/L — ABNORMAL LOW (ref 15–41)
Albumin: 3.7 g/dL (ref 3.5–5.0)
Alkaline Phosphatase: 49 U/L (ref 38–126)
Anion gap: 10 (ref 5–15)
BUN: 17 mg/dL (ref 6–20)
CO2: 25 mmol/L (ref 22–32)
Calcium: 9 mg/dL (ref 8.9–10.3)
Chloride: 106 mmol/L (ref 98–111)
Creatinine, Ser: 0.77 mg/dL (ref 0.44–1.00)
GFR, Estimated: >60 mL/min (ref >60)
Glucose, Bld: 102 mg/dL — ABNORMAL HIGH (ref 70–99)
Potassium: 4 mmol/L (ref 3.5–5.1)
Sodium: 141 mmol/L (ref 135–145)
Total Bilirubin: 0.4 mg/dL (ref 0.0–1.2)
Total Protein: 6.7 g/dL (ref 6.5–8.1)

## 2023-12-16 LAB — URINALYSIS, ROUTINE W REFLEX MICROSCOPIC
Bilirubin Urine: NEGATIVE
Glucose, UA: NEGATIVE mg/dL
Hgb urine dipstick: NEGATIVE
Ketones, ur: NEGATIVE mg/dL
Leukocytes,Ua: NEGATIVE
Nitrite: NEGATIVE
Protein, ur: NEGATIVE mg/dL
Specific Gravity, Urine: 1.011 (ref 1.005–1.030)
pH: 7 (ref 5.0–8.0)

## 2023-12-16 LAB — CBC
HCT: 42.5 % (ref 36.0–46.0)
Hemoglobin: 13.8 g/dL (ref 12.0–15.0)
MCH: 30.7 pg (ref 26.0–34.0)
MCHC: 32.5 g/dL (ref 30.0–36.0)
MCV: 94.7 fL (ref 80.0–100.0)
Platelets: 204 K/uL (ref 150–400)
RBC: 4.49 MIL/uL (ref 3.87–5.11)
RDW: 11.8 % (ref 11.5–15.5)
WBC: 6.1 K/uL (ref 4.0–10.5)
nRBC: 0 % (ref 0.0–0.2)

## 2023-12-16 LAB — TROPONIN I (HIGH SENSITIVITY): Troponin I (High Sensitivity): <2 ng/L (ref <18)

## 2023-12-16 LAB — RESP PANEL BY RT-PCR (RSV, FLU A&B, COVID)  RVPGX2
Influenza A by PCR: NEGATIVE
Influenza B by PCR: NEGATIVE
Resp Syncytial Virus by PCR: NEGATIVE
SARS Coronavirus 2 by RT PCR: NEGATIVE

## 2023-12-16 LAB — PREGNANCY, URINE: Preg Test, Ur: NEGATIVE

## 2023-12-16 LAB — HCG, QUANTITATIVE, PREGNANCY: hCG, Beta Chain, Quant, S: <1 m[IU]/mL (ref <5)

## 2023-12-16 MED ORDER — LIDOCAINE 5 % EX PTCH: 1.0000 | MEDICATED_PATCH | CUTANEOUS | 0 refills | Status: AC

## 2023-12-16 MED ORDER — LIDOCAINE 5 % EX PTCH
1.0000 | MEDICATED_PATCH | CUTANEOUS | Status: DC
  Administered 2023-12-16: 1 via TRANSDERMAL
  Filled 2023-12-16: qty 1

## 2023-12-16 MED ORDER — LIDOCAINE 5 % EX PTCH: 1.0000 | MEDICATED_PATCH | CUTANEOUS | 0 refills | Status: DC

## 2023-12-16 MED ORDER — DIPHENHYDRAMINE HCL 50 MG/ML IJ SOLN
12.5000 mg | Freq: Once | INTRAMUSCULAR | Status: AC
  Administered 2023-12-16: 12.5 mg via INTRAVENOUS
  Filled 2023-12-16: qty 1

## 2023-12-16 MED ORDER — METOCLOPRAMIDE HCL 5 MG/ML IJ SOLN
10.0000 mg | Freq: Once | INTRAMUSCULAR | Status: AC
  Administered 2023-12-16: 10 mg via INTRAVENOUS
  Filled 2023-12-16: qty 2

## 2023-12-16 MED ORDER — ONDANSETRON 4 MG PO TBDP: 4.0000 mg | ORAL_TABLET | Freq: Three times a day (TID) | ORAL | 0 refills | Status: DC | PRN

## 2023-12-16 MED ORDER — ONDANSETRON 4 MG PO TBDP
4.0000 mg | ORAL_TABLET | Freq: Three times a day (TID) | ORAL | 0 refills | Status: AC | PRN
Start: 2023-12-16 — End: 2023-12-21

## 2023-12-16 NOTE — ED Triage Notes (Addendum)
 Pt to ED via POV from home. Pt reports yesterday was feeling fatigued and nauseas all day. Pt also reports HA and lower back pain since this am. Pt reports some scant vaginal bleeding this morning as well. Pt LMP 8/12. Pt denies urinary symptoms or V/D.  Hx of PCOS and polycystic kidney disease.

## 2023-12-16 NOTE — Discharge Instructions (Addendum)
 You can take Tylenol 1 g every 8 hours and alternate with ibuprofen 600 every 8 hours with food to help with pain for one week.  Use Zofran  to help with any nausea but also help with headaches.  Return to the ER if you develop pain in your lower abdomen, fevers, any other concern

## 2023-12-16 NOTE — ED Provider Notes (Signed)
 [facility] Provider Note    Event Date/Time   First MD Initiated Contact with Patient 12/16/23 [phone]     (approximate)   History   Headache, Fatigue (/), and Back Pain   HPI  Kelli Farrell is a 43 y.o. female with history of PCOS, polycystic kidney disease who comes in with feeling unwell with headaches, low back pain, nausea, fatigue.  Reports that she woke up around 330 from a headache.  She reports that the headache gradually got worse but it was the worst headache that she has ever had.  She does not typically get headaches.  She denies any chest pain, shortness of breath but she just reports feeling really nauseous, fatigue.  She also reports some intermittent spotting.  She was told that she has a fibroid.  She denies any pain in her abdomen.  She reports a little bit of low back pain.  However if she is sitting still she does not have pain but when she stands up she reports worsening pain.   Physical Exam   Triage Vital Signs: ED Triage Vitals [12/16/23 0724]  Encounter Vitals Group     BP (!) 180/99     Girls Systolic BP Percentile      Girls Diastolic BP Percentile      Boys Systolic BP Percentile      Boys Diastolic BP Percentile      Pulse Rate 76     Resp 18     Temp 97.9 F (36.6 C)     Temp Source Oral     SpO2 98 %     Weight 255 lb (115.7 kg)     Height 5' 11 (1.803 m)     Head Circumference      Peak Flow      Pain Score 8     Pain Loc      Pain Education      Exclude from Growth Chart     Most recent vital signs: Vitals:   12/16/23 0724  BP: (!) 180/99  Pulse: 76  Resp: 18  Temp: 97.9 F (36.6 C)  SpO2: 98%     General: Awake, no distress.  CV:  Good peripheral perfusion.  Resp:  Normal effort.  Abd:  No distention.  Soft and nontender Other:  No rash noted no CVA tenderness.  No pain in her lower back with palpation while she is sitting.  If she stands up and moves she reports pain.  Patient well-appearing no  meningismus, cranial nerves appear intact. Equal strength in arms and legs.  No numbness no tingling.  No saddle anesthesia patient had denied any bowel or rectal incontinence   ED Results / Procedures / Treatments   Labs (all labs ordered are listed, but only abnormal results are displayed) Labs Reviewed  RESP PANEL BY RT-PCR (RSV, FLU A&B, COVID)  RVPGX2  CBC  COMPREHENSIVE METABOLIC PANEL WITH GFR  URINALYSIS, ROUTINE W REFLEX MICROSCOPIC  HCG, QUANTITATIVE, PREGNANCY  TROPONIN I (HIGH SENSITIVITY)     EKG  My interpretation of EKG:  Sinus rate 69 without any ST elevation or T wave inversions except for V2, normal intervals  RADIOLOGY I have reviewed the ct personally and interpreted no intracranial hemorrhage   PROCEDURES:  Critical Care performed: No  Procedures   MEDICATIONS ORDERED IN ED: Medications  lidocaine  (LIDODERM ) 5 % 1 patch (1 patch Transdermal Patch Applied 12/16/23 0816)  metoCLOPramide  (REGLAN ) injection 10 mg (10 mg Intravenous Given  12/16/23 0815)  diphenhydrAMINE  (BENADRYL ) injection 12.5 mg (12.5 mg Intravenous Given 12/16/23 0816)     IMPRESSION / MDM / ASSESSMENT AND PLAN / ED COURSE  I reviewed the triage vital signs and the nursing notes.   Patient's presentation is most consistent with acute presentation with potential threat to life or bodily function.   Patient comes in with a headache that she reports is the worst headache of her life, woke her up from sleep therefore we will get CT imaging to ensure no evidence of intercranial hemorrhage.  Will treat with migraine cocktail.  She is within the 6-hour window so CT imaging would be accurate to rule out subarachnoid.  However she does report that it was gradually getting worse and she is neuro intact so CT is negative then I suspect that it is not a subarachnoid.  She also reports other symptoms that do not seem to correlate with the above.  She has got some mild low back pain that seems more  musculoskeletal in nature she is soft and nontender abdomen we discussed ultrasound but have opted to hold off at this time given no lower ovarian pain to suggest ovarian torsion.  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  She does have a history of ectopic pregnancy.  Her pregnancy test is positive we will proceed with ultrasound to rule out ectopic pregnancy.  Will test for COVID, flu.  CT head was negative. Cbc/cmpReassuring hCG was negative  9:28 AM patient reports feeling much improved.  Headache is significantly resolved.  She continues to deny any pain in her lower back unless she moves.  Repeat abdominal exam remains soft and nontender.  We discussed ultrasound to image her cyst but at this time this does not seem consistent with ovarian torsion given no lower pelvic pain and she is agreeable to and prefers to hold off on ultrasound at this time.  We did discuss return precautions in regards to this.  I suspect that her spotting is just related to a fibroid that she has already been diagnosed with.  Will give her an OB number to follow-up with outpatient.  She feels comfortable with outpatient follow-up and is requesting a work note.  Will prescribe some Zofran  and lidocaine  patches and recommended alternating Tylenol, ibuprofen for 1 week to help with symptoms.  She expressed understanding felt comfortable with this plan      FINAL CLINICAL IMPRESSION(S) / ED DIAGNOSES   Final diagnoses:  Other fatigue  Acute intractable headache, unspecified headache type     Rx / DC Orders   ED Discharge Orders          Ordered    ondansetron  (ZOFRAN -ODT) 4 MG disintegrating tablet  Every 8 hours PRN        12/16/23 0930    lidocaine  (LIDODERM ) 5 %  Every 24 hours        12/16/23 0930             Note:  This document was prepared using Dragon voice recognition  software and may include unintentional dictation errors.   Ernest Ronal BRAVO, MD 12/16/23 [phone]

## 2024-01-03 ENCOUNTER — Emergency Department
Admission: EM | Admit: 2024-01-03 | Discharge: 2024-01-03 | Disposition: A | Discharge status: Home / Self Care | Attending: Emergency Medicine | Admitting: Emergency Medicine

## 2024-01-03 ENCOUNTER — Encounter: Payer: Self-pay | Admitting: *Deleted

## 2024-01-03 ENCOUNTER — Other Ambulatory Visit: Payer: Self-pay

## 2024-01-03 DIAGNOSIS — B349 Viral infection, unspecified: Secondary | ICD-10-CM | POA: Insufficient documentation

## 2024-01-03 DIAGNOSIS — R509 Fever, unspecified: Secondary | ICD-10-CM | POA: Diagnosis present

## 2024-01-03 LAB — RESP PANEL BY RT-PCR (RSV, FLU A&B, COVID)  RVPGX2
Influenza A by PCR: NEGATIVE
Influenza B by PCR: NEGATIVE
Resp Syncytial Virus by PCR: NEGATIVE
SARS Coronavirus 2 by RT PCR: NEGATIVE

## 2024-01-03 NOTE — ED Triage Notes (Signed)
 Pt reports two days of body aches and chills. Coworker at work is also sick. Ibuprofen at 0330

## 2024-01-03 NOTE — ED Provider Notes (Signed)
   [facility] Provider Note    Event Date/Time   First MD Initiated Contact with Patient 01/03/24 0533     (approximate)  History   Chief Complaint: Generalized Body Aches  HPI  Tamarra Geiselman is a 43 y.o. female with no significant past medical history presents to the emergency department for chills and bodyaches.  According to the patient for the past 2 days she has been experiencing chills and bodyaches.  States a coworker is also sick with similar symptoms.  Patient denies any cough congestion nausea vomiting diarrhea.  Denies any chest pain shortness of breath abdominal pain or urinary symptoms.  Vital signs are reassuring.  Physical Exam   Triage Vital Signs: ED Triage Vitals  Encounter Vitals Group     BP 01/03/24 0529 (!) 148/93     Girls Systolic BP Percentile --      Girls Diastolic BP Percentile --      Boys Systolic BP Percentile --      Boys Diastolic BP Percentile --      Pulse Rate 01/03/24 0529 79     Resp 01/03/24 0529 18     Temp 01/03/24 0529 (!) 97.5 F (36.4 C)     Temp Source 01/03/24 0529 Oral     SpO2 01/03/24 0529 100 %     Weight --      Height --      Head Circumference --      Peak Flow --      Pain Score 01/03/24 0528 8     Pain Loc --      Pain Education --      Exclude from Growth Chart --     Most recent vital signs: Vitals:   01/03/24 0529 01/03/24 0542  BP: (!) 148/93   Pulse: 79   Resp: 18   Temp: (!) 97.5 F (36.4 C) 97.7 F (36.5 C)  SpO2: 100%     General: Awake, no distress.  CV:  Good peripheral perfusion.  Regular rate and rhythm  Resp:  Normal effort.  Equal breath sounds bilaterally.  Abd:  No distention.  Soft, nontender.  ED Results / Procedures / Treatments   MEDICATIONS ORDERED IN ED: Medications - No data to display   IMPRESSION / MDM / ASSESSMENT AND PLAN / ED COURSE  I reviewed the triage vital signs and the nursing notes.  Patient's presentation is most consistent with acute  illness / injury with system symptoms.  Patient presents to the emergency department for 2 days of chills/subjective fever body aches.  Overall the patient appears well, no focal complaints.  Largely negative review of systems.  Reassuring vital signs.  Reassuring physical exam.  Patient appears well and nontoxic.  Will obtain a COVID/flu swab.  Given the patient's coworkers also sick I suspect likely viral illness.  If the respiratory panel is negative I discussed with the patient supportive care at home and follow-up with her PCP.  Patient agreeable to plan.  Patient's respiratory panel is negative.  Will discharge home with supportive care.  Patient agreeable to plan of care.  FINAL CLINICAL IMPRESSION(S) / ED DIAGNOSES   Viral illness   Note:  This document was prepared using Dragon voice recognition software and may include unintentional dictation errors.   Dorothyann Drivers, MD 01/03/24 [phone]

## 2024-06-01 ENCOUNTER — Other Ambulatory Visit: Payer: Self-pay

## 2024-06-01 ENCOUNTER — Emergency Department

## 2024-06-01 ENCOUNTER — Emergency Department
Admission: EM | Admit: 2024-06-01 | Discharge: 2024-06-01 | Disposition: A | Discharge status: Home / Self Care | Source: Home / Self Care

## 2024-06-01 ENCOUNTER — Encounter: Payer: Self-pay | Admitting: Emergency Medicine

## 2024-06-01 DIAGNOSIS — J069 Acute upper respiratory infection, unspecified: Secondary | ICD-10-CM

## 2024-06-01 LAB — RESP PANEL BY RT-PCR (RSV, FLU A&B, COVID)  RVPGX2
Influenza A by PCR: NEGATIVE
Influenza B by PCR: NEGATIVE
Resp Syncytial Virus by PCR: NEGATIVE
SARS Coronavirus 2 by RT PCR: NEGATIVE

## 2024-06-01 MED ORDER — BENZONATATE 100 MG PO CAPS: 200.0000 mg | ORAL_CAPSULE | Freq: Three times a day (TID) | ORAL | 0 refills | Status: AC | PRN

## 2024-06-01 MED ORDER — ALBUTEROL SULFATE HFA 108 (90 BASE) MCG/ACT IN AERS: 2.0000 | INHALATION_SPRAY | Freq: Four times a day (QID) | RESPIRATORY_TRACT | 2 refills | Status: AC | PRN

## 2024-06-01 MED ORDER — PSEUDOEPH-BROMPHEN-DM 30-2-10 MG/5ML PO SYRP: 5.0000 mL | ORAL_SOLUTION | Freq: Four times a day (QID) | ORAL | 0 refills | Status: AC | PRN

## 2024-06-01 MED ORDER — PREDNISONE 20 MG PO TABS: 40.0000 mg | ORAL_TABLET | Freq: Every day | ORAL | 0 refills | Status: AC

## 2024-06-01 NOTE — ED Triage Notes (Signed)
 Pt via POV from home. Pt c/o cough and congestion for the past week, states it it painful in her chest when she coughs. Denies hx of COPD/CHF. Pt is A&Ox4 and NAD, ambulatory to triage.

## 2024-06-01 NOTE — ED Provider Notes (Signed)
 "   [facility] Provider Note     Event Date/Time   First MD Initiated Contact with Patient 06/01/24 1221     (approximate)   History   Cough   HPI  Kelli Farrell is a 44 y.o. female with a history of ovarian cyst, polycystic kidney disease who presents to the ED endorsing 1 week of cough and congestion.  Patient endorses chest tightness with her cough.  She reports cough is nonproductive.  No reports of any fevers, chills, or sweats.  Physical Exam   Triage Vital Signs: ED Triage Vitals  Encounter Vitals Group     BP 06/01/24 1231 (!) 157/103     Girls Systolic BP Percentile --      Girls Diastolic BP Percentile --      Boys Systolic BP Percentile --      Boys Diastolic BP Percentile --      Pulse Rate 06/01/24 1231 80     Resp 06/01/24 1231 18     Temp 06/01/24 1231 98.3 F (36.8 C)     Temp Source 06/01/24 1231 Oral     SpO2 06/01/24 1231 98 %     Weight 06/01/24 1230 250 lb (113.4 kg)     Height 06/01/24 1230 5' 11 (1.803 m)     Head Circumference --      Peak Flow --      Pain Score 06/01/24 1230 0     Pain Loc --      Pain Education --      Exclude from Growth Chart --     Most recent vital signs: Vitals:   06/01/24 1231  BP: (!) 157/103  Pulse: 80  Resp: 18  Temp: 98.3 F (36.8 C)  SpO2: 98%    General Awake, no distress. NAD HEENT NCAT. PERRL. EOMI. No rhinorrhea. Mucous membranes are moist.  CV:  Good peripheral perfusion.  RRR RESP:  Normal effort.  CTA.  No wheeze, rale, rhonchi noted   ED Results / Procedures / Treatments   Labs (all labs ordered are listed, but only abnormal results are displayed) Labs Reviewed  RESP PANEL BY RT-PCR (RSV, FLU A&B, COVID)  RVPGX2     EKG   RADIOLOGY  I personally viewed and evaluated these images as part of my medical decision making, as well as reviewing the written report by the radiologist.  ED Provider Interpretation: No acute findings  DG  Chest 2 View Result Date: 06/01/2024 CLINICAL DATA:  Cough for 1 week.  Chest pain. EXAM: CHEST - 2 VIEW COMPARISON:  03/07/2023 FINDINGS: A mild pectus excavatum deformity. Midline trachea. Normal heart size and mediastinal contours. No pleural effusion or pneumothorax. Resolved right middle lobe airspace disease. IMPRESSION: No acute cardiopulmonary disease. Electronically Signed   By: Rockey Kilts M.D.   On: 06/01/2024 13:18     PROCEDURES:  Critical Care performed: No  Procedures   MEDICATIONS ORDERED IN ED: Medications - No data to display   IMPRESSION / MDM / ASSESSMENT AND PLAN / ED COURSE  I reviewed the triage vital signs and the nursing notes.                              Differential diagnosis includes, but is not limited to, COVID, flu, RSV, viral URI, CAP, bronchitis  Patient's presentation is most consistent with acute complicated illness / injury requiring diagnostic workup.  Patient's diagnosis  is consistent with viral URI with cough.  Patient presents to the ED in no acute respiratory distress, for evaluation of 1 week of intermittent cough.  Viral panel test is negative, and chest x-ray interpreted by me, shows no acute process.  Patient is stable for outpatient management.  Patient will be discharged home with prescriptions for albuterol  MDI, Tessalon  Perles, Bromfed syrup, and prednisone . Patient is to follow up with her PCP as suggested, as needed or otherwise directed. Patient is given ED precautions to return to the ED for any worsening or new symptoms.   FINAL CLINICAL IMPRESSION(S) / ED DIAGNOSES   Final diagnoses:  Viral URI with cough     Rx / DC Orders   ED Discharge Orders          Ordered    albuterol  (VENTOLIN  HFA) 108 (90 Base) MCG/ACT inhaler  Every 6 hours PRN        06/01/24 1306    benzonatate  (TESSALON ) 100 MG capsule  3 times daily PRN        06/01/24 1306    brompheniramine-pseudoephedrine-DM 30-2-10 MG/5ML syrup  4 times daily PRN         06/01/24 1306    predniSONE  (DELTASONE ) 20 MG tablet  Daily with breakfast        06/01/24 1338             Note:  This document was prepared using Dragon voice recognition software and may include unintentional dictation errors.    Loyd Candida LULLA Aldona, PA-C 06/01/24 2037  "

## 2024-06-01 NOTE — ED Notes (Signed)
Patient discharged to home per MD order. Patient in stable condition, and deemed medically cleared by ED provider for discharge. Discharge instructions reviewed with patient/family using "Teach Back"; verbalized understanding of medication education and administration, and information about follow-up care. Denies further concerns. ° °

## 2024-06-01 NOTE — Discharge Instructions (Addendum)
 Your exam, chest x-ray, and viral panel test are negative at this time.  No signs of an acute viral infection due to COVID, flu, or RSV.  Your x-ray showed no evidence of acute pneumonia or bronchitis.  Take prescription meds as directed.  Follow-up with your primary provider or return to the ED if necessary.
